# Patient Record
Sex: Male | Born: 1940 | Race: White | Hispanic: No | Marital: Married | Smoking: Former smoker
Health system: Southern US, Community
[De-identification: ages and names within clinical notes are randomized; demographics above are authoritative.]

## PROBLEM LIST (undated history)

## (undated) DIAGNOSIS — Z87442 Personal history of urinary calculi: Secondary | ICD-10-CM

## (undated) DIAGNOSIS — G454 Transient global amnesia: Secondary | ICD-10-CM

## (undated) DIAGNOSIS — I2510 Atherosclerotic heart disease of native coronary artery without angina pectoris: Secondary | ICD-10-CM

## (undated) DIAGNOSIS — I8390 Asymptomatic varicose veins of unspecified lower extremity: Secondary | ICD-10-CM

## (undated) DIAGNOSIS — E7800 Pure hypercholesterolemia, unspecified: Secondary | ICD-10-CM

## (undated) DIAGNOSIS — I10 Essential (primary) hypertension: Secondary | ICD-10-CM

## (undated) DIAGNOSIS — M1990 Unspecified osteoarthritis, unspecified site: Secondary | ICD-10-CM

## (undated) DIAGNOSIS — R413 Other amnesia: Secondary | ICD-10-CM

## (undated) HISTORY — DX: Other amnesia: R41.3

## (undated) HISTORY — DX: Asymptomatic varicose veins of unspecified lower extremity: I83.90

## (undated) HISTORY — DX: Transient global amnesia: G45.4

---

## 1998-09-16 ENCOUNTER — Emergency Department (HOSPITAL_COMMUNITY): Admission: EM | Admit: 1998-09-16 | Discharge: 1998-09-16 | Payer: Self-pay

## 2003-11-20 ENCOUNTER — Ambulatory Visit (HOSPITAL_COMMUNITY): Admission: RE | Admit: 2003-11-20 | Discharge: 2003-11-20 | Payer: Self-pay | Admitting: Cardiovascular Disease

## 2009-03-30 ENCOUNTER — Emergency Department (HOSPITAL_COMMUNITY): Admission: EM | Admit: 2009-03-30 | Discharge: 2009-03-30 | Payer: Self-pay | Admitting: Emergency Medicine

## 2009-05-21 ENCOUNTER — Emergency Department (HOSPITAL_COMMUNITY): Admission: EM | Admit: 2009-05-21 | Discharge: 2009-05-21 | Payer: Self-pay | Admitting: Emergency Medicine

## 2009-07-02 ENCOUNTER — Encounter (INDEPENDENT_AMBULATORY_CARE_PROVIDER_SITE_OTHER): Payer: Self-pay | Admitting: General Surgery

## 2009-07-02 ENCOUNTER — Ambulatory Visit (HOSPITAL_COMMUNITY): Admission: RE | Admit: 2009-07-02 | Discharge: 2009-07-02 | Payer: Self-pay | Admitting: General Surgery

## 2009-07-04 IMAGING — CT CT PELVIS W/O CM
2 of 4 series · 17 of 46 positions shown, 19 images · non-contrast
Comparison: None

CT ABDOMEN

CLINICAL DATA: Right-sided flank pain.  Onset last night.  No
vomiting or diarrhea.  No fever or chills.

CT ABDOMEN AND PELVIS WITHOUT CONTRAST (CT UROGRAM)
TECHNIQUE: Contiguous axial images of the abdomen and pelvis
without oral or intravenous contrast were obtained.

[Series 2: renal stone · axial · 0.85mm/px · z∈[-510,-30]mm · 14 of 106 slices shown, 16 images]
[im 5/106  soft-tissue]
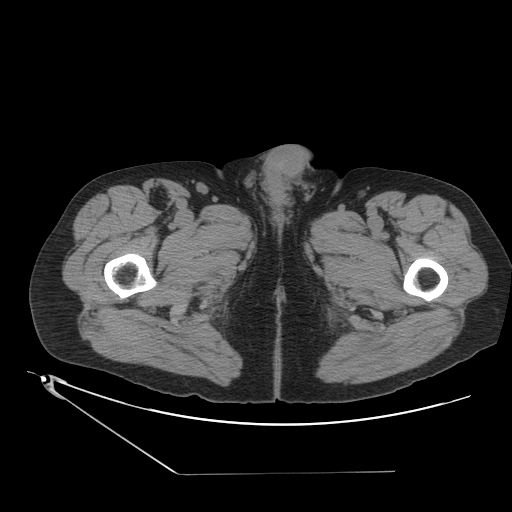
[im 5/106  bone]
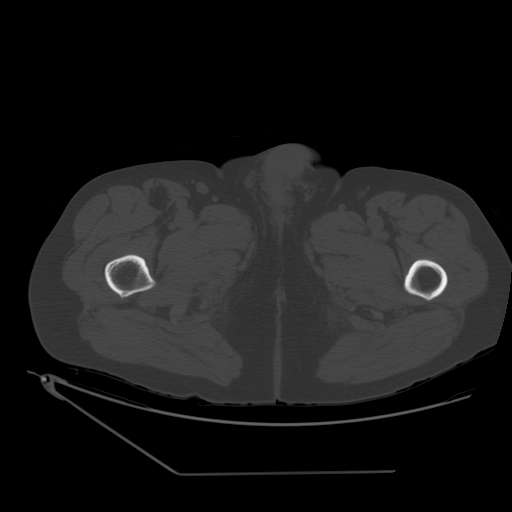
[im 13/106  soft-tissue]
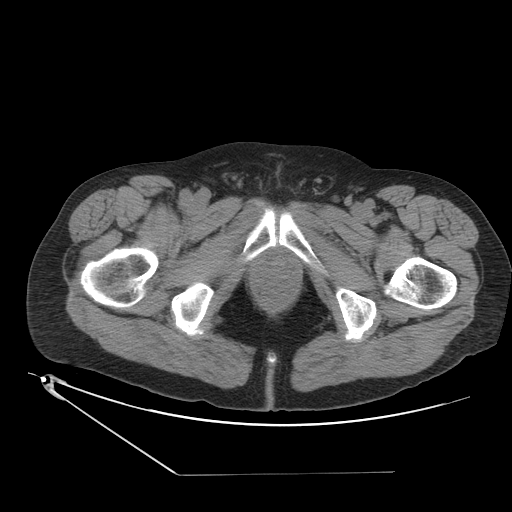
[im 22/106  soft-tissue]
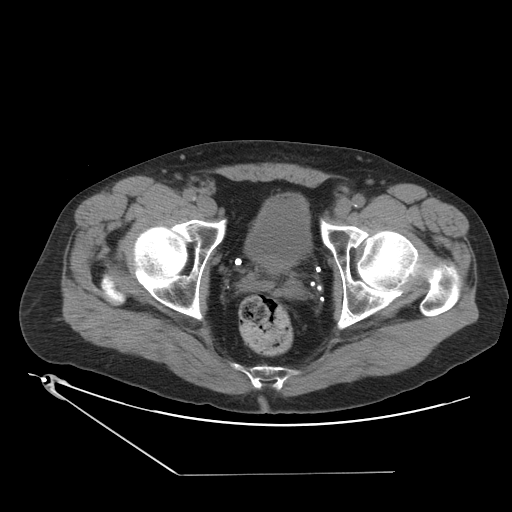
[im 30/106  soft-tissue]
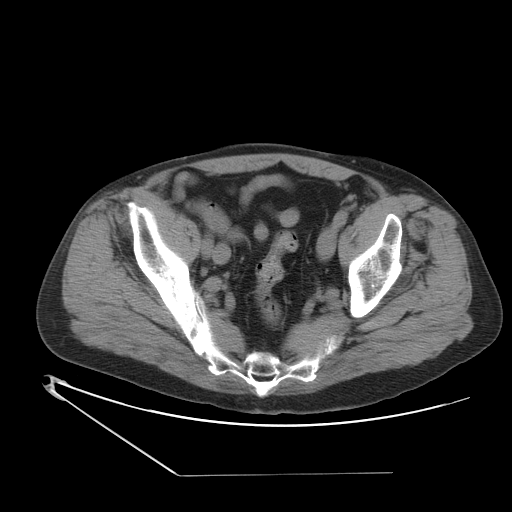
[im 34/106  soft-tissue]
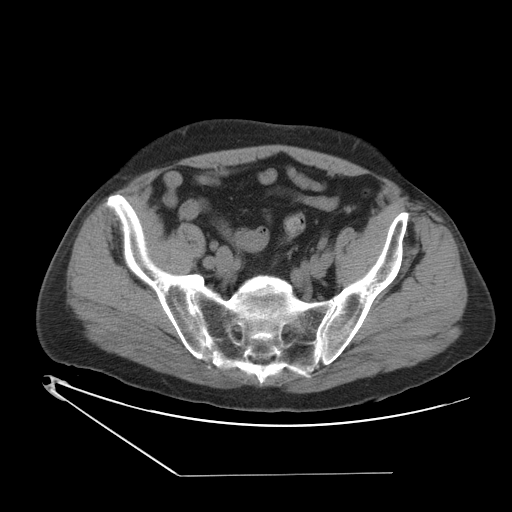
[im 43/106  soft-tissue]
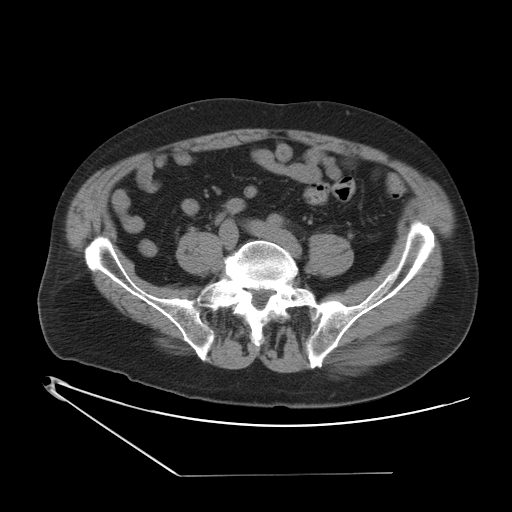
[im 51/106  soft-tissue]
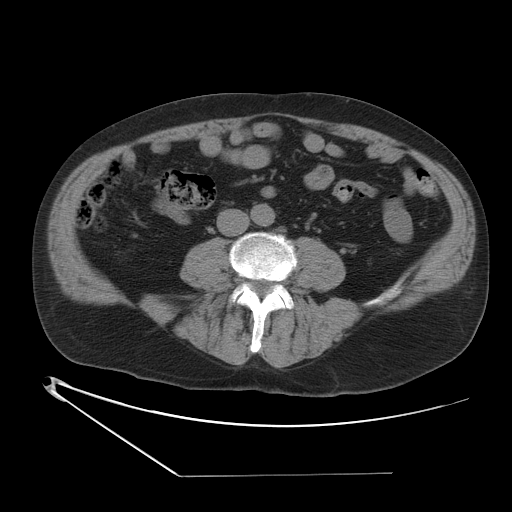
[im 55/106  soft-tissue]
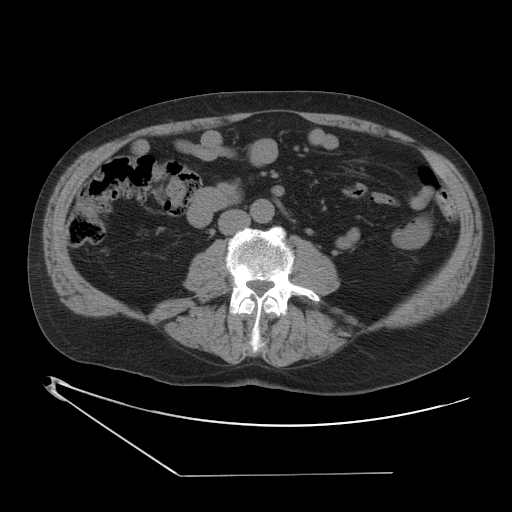
[im 64/106  soft-tissue]
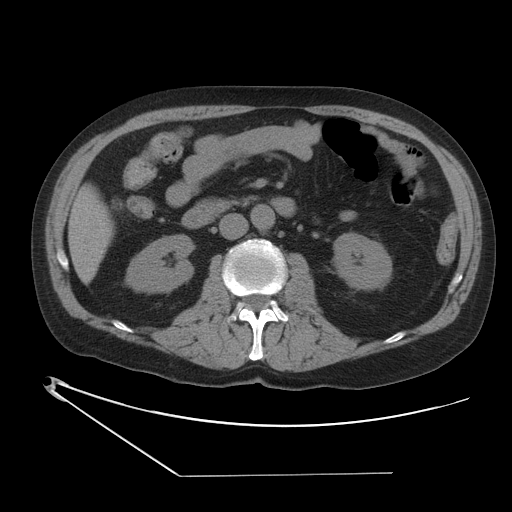
[im 64/106  bone]
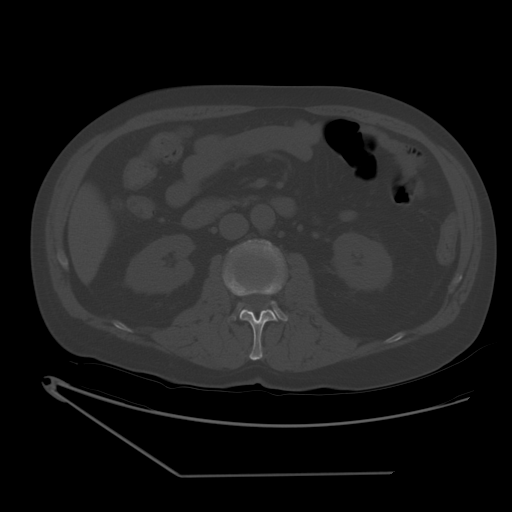
[im 72/106  soft-tissue]
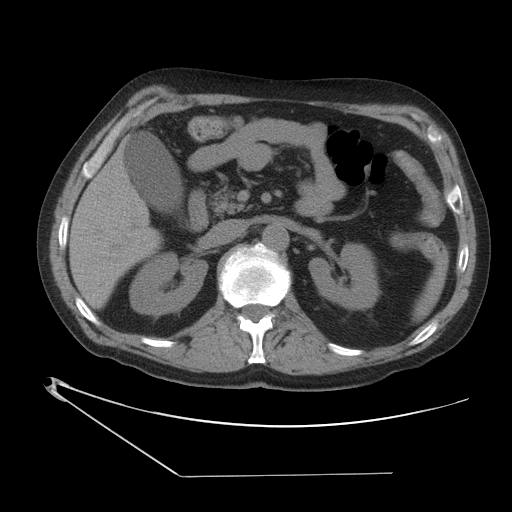
[im 80/106  soft-tissue]
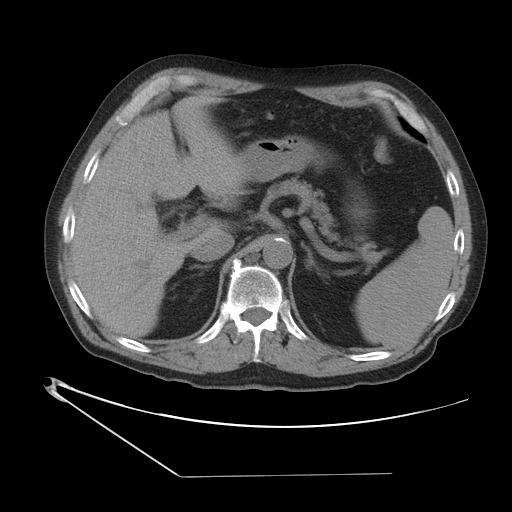
[im 85/106  soft-tissue]
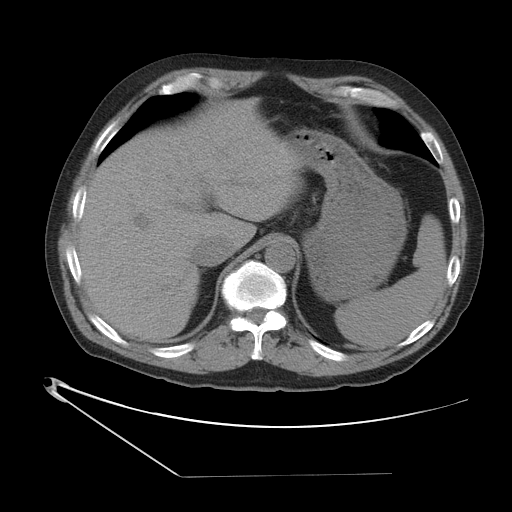
[im 93/106  soft-tissue]
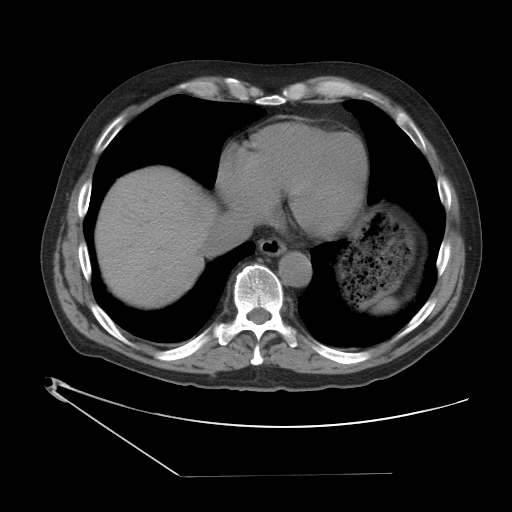
[im 101/106  soft-tissue]
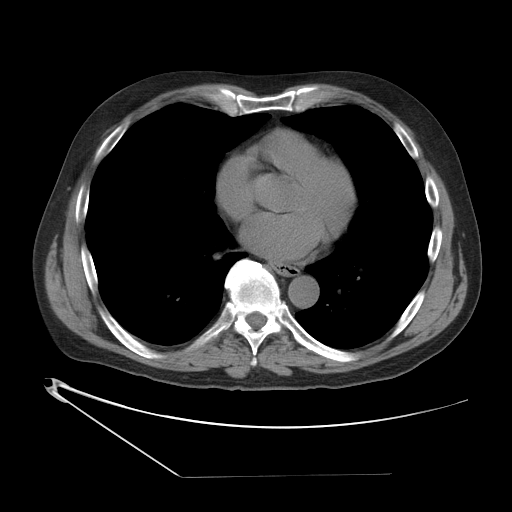

[Series 401: reformatted · coronal · 1.04mm/px · 3 of 166 slices shown]
[im 56/166  soft-tissue]
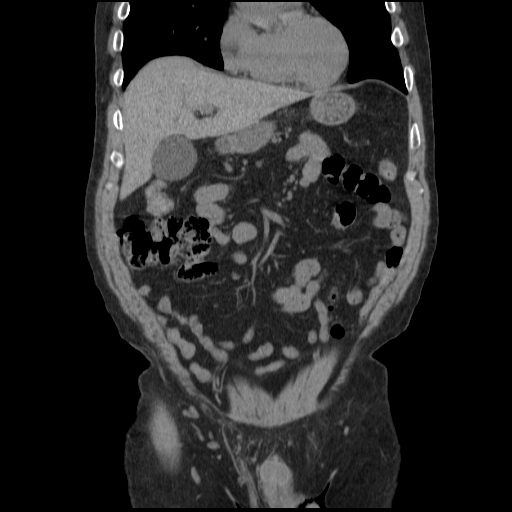
[im 74/166  soft-tissue]
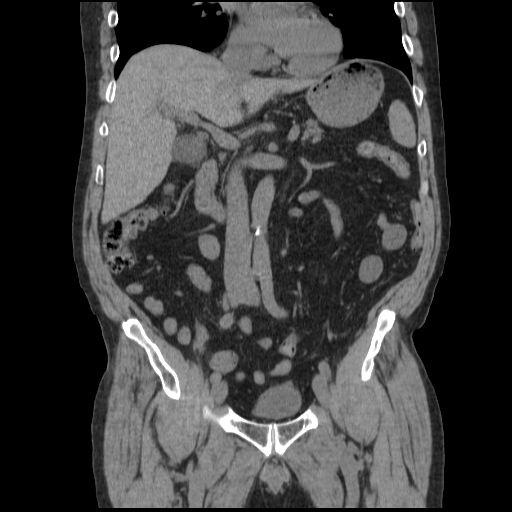
[im 92/166  soft-tissue]
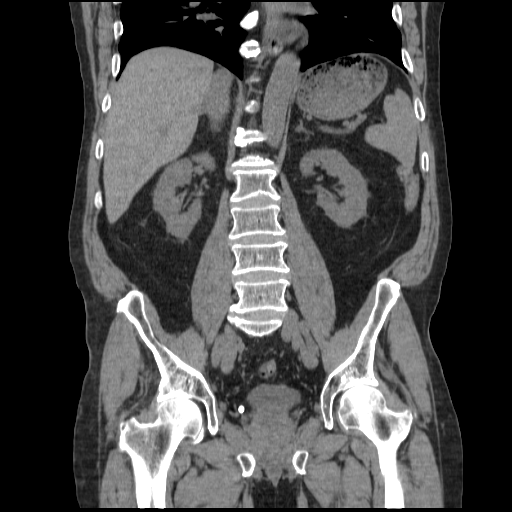

[17 of 46 positions shown; findings below may reference images not displayed]

FINDINGS: Exam is limited for evaluation of entities other than
urinary tract calculi due to lack of oral or intravenous contrast.

 Mild bibasilar atelectasis.  Mild coronary atherosclerosis. Normal
heart size without pericardial or pleural effusion.  Normal
uninfused appearance of the liver, spleen, stomach, pancreas.
Borderline prominence of the gallbladder.  No calcified stone or
specific evidence of acute cholecystitis.  No biliary ductal
dilatation.

Normal adrenal glands.  Mild perirenal edema is symmetric and
nonspecific but may relate to mild renal insufficiency.  No renal
calculi or hydronephrosis.

No retroperitoneal or retrocrural adenopathy. Normal colon and
terminal ileum.  The appendix is normal on image 61. Normal
abdominal small bowel without ascites.
IMPRESSION: 1. No renal calculi or hydronephrosis.
2.  Normal appendix.
3.  Borderline prominence of the gallbladder without specific
evidence of acute cholecystitis.  If this is a clinical concern,
ultrasound would be suggested.

CT PELVIS
FINDINGS: No distal urinary tract calculi or hydroureter.
Scattered colonic diverticula.  Normal pelvic small bowel. No
pelvic adenopathy.    Normal urinary bladder.  Mild prostatomegaly.
No significant free fluid.
IMPRESSION: No acute pelvic process.

## 2009-10-02 IMAGING — CR DG CHEST 2V
2 series · 2 of 2 positions shown · non-contrast
Comparison: None

CLINICAL DATA: Cholecystitis. Preop respiratory exam.

CHEST - 2 VIEW

[view not recorded (1 of 2)]
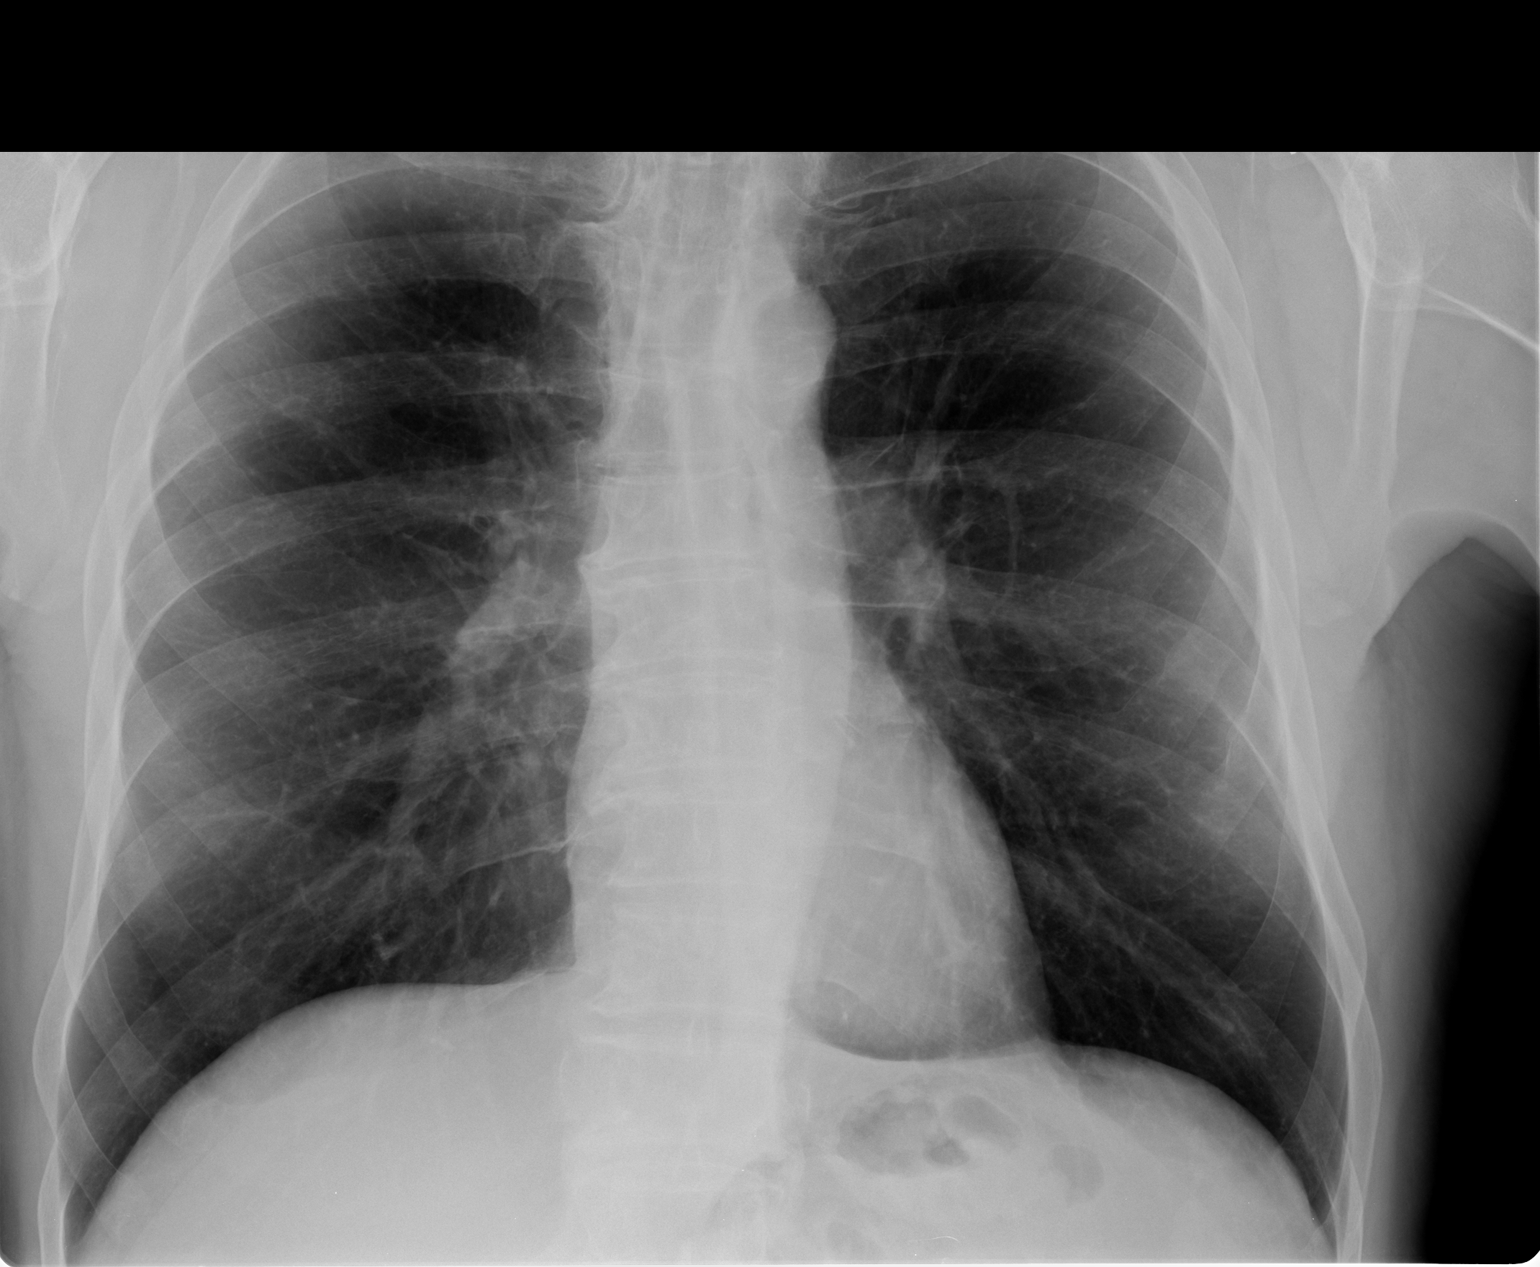

[view not recorded (2 of 2)]
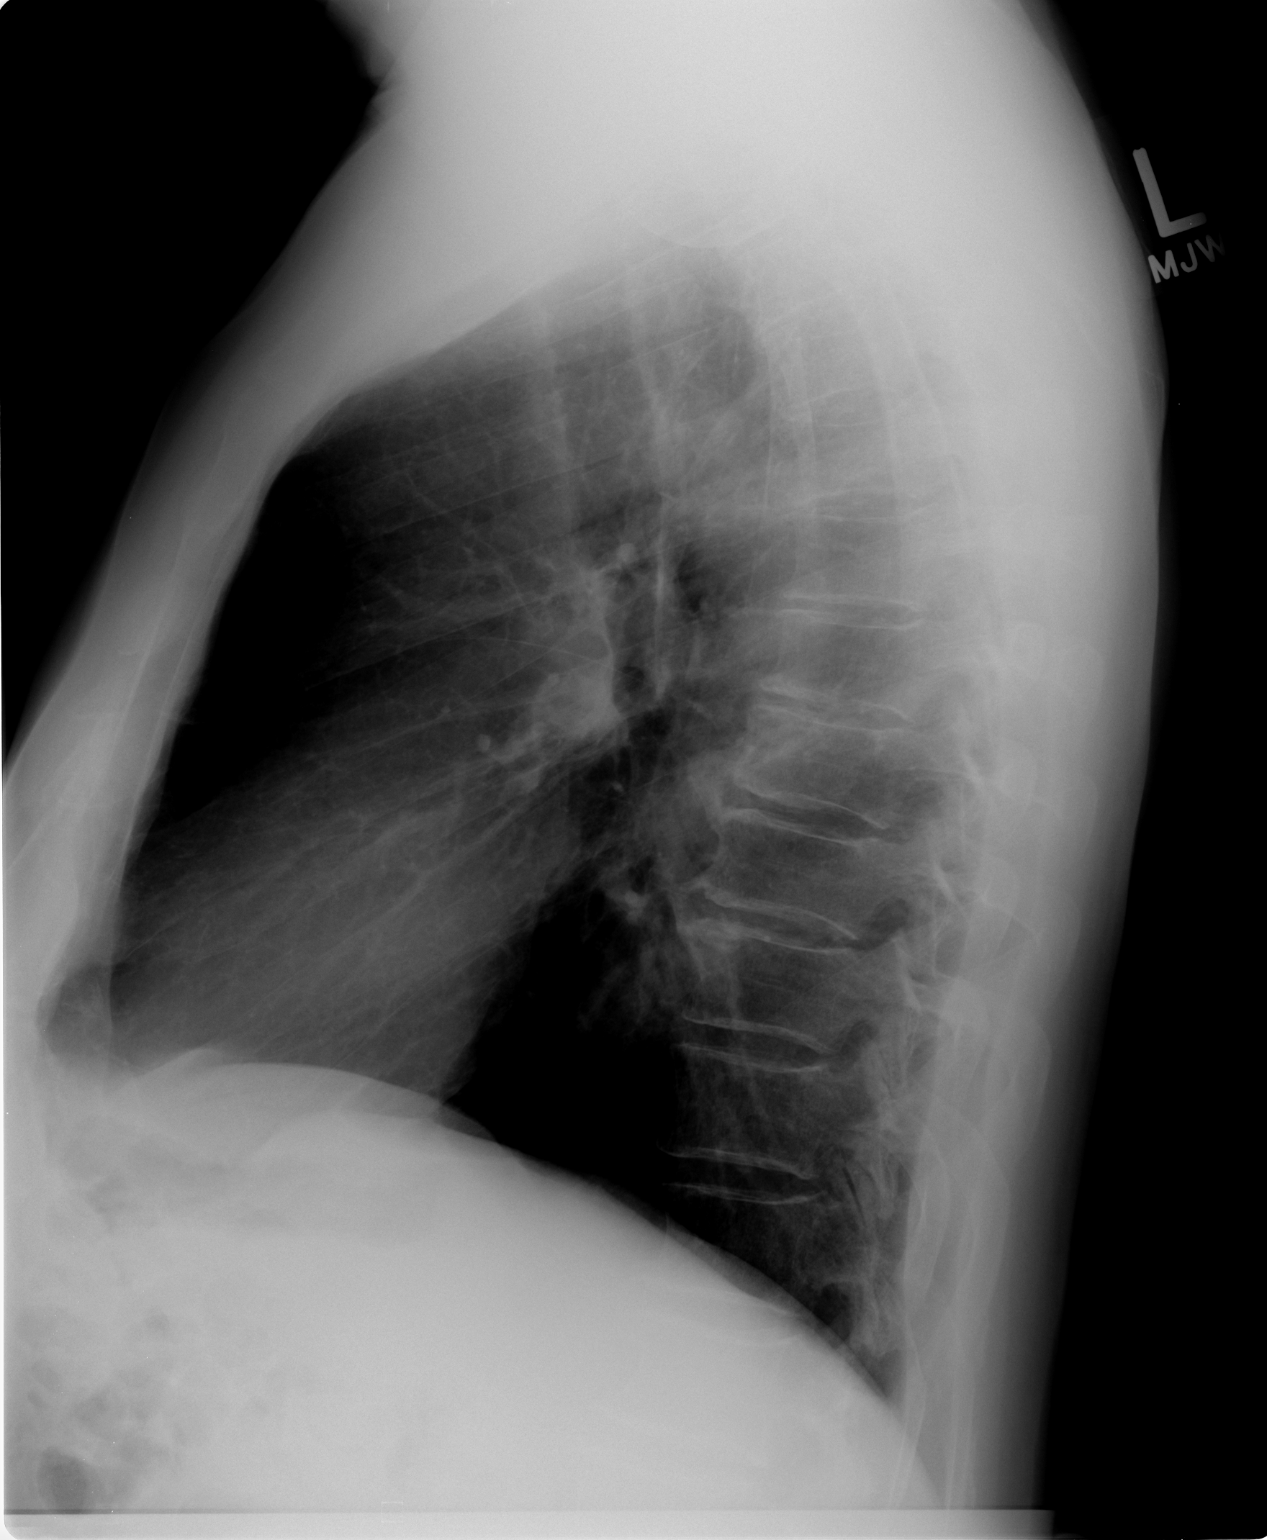

[2 of 2 positions shown; findings below may reference images not displayed]

FINDINGS: The cardiac silhouette, mediastinal and hilar contours
are within normal limits.  Mild COPD changes.  No acute pulmonary
findings.  The bony thorax is intact.
IMPRESSION: No acute cardiopulmonary findings.  Mild COPD changes.

## 2011-02-18 LAB — DIFFERENTIAL
Basophils Absolute: 0 10*3/uL (ref 0.0–0.1)
Basophils Relative: 1 % (ref 0–1)
Eosinophils Absolute: 0.1 10*3/uL (ref 0.0–0.7)
Eosinophils Relative: 2 % (ref 0–5)
Lymphocytes Relative: 23 % (ref 12–46)
Lymphs Abs: 1.3 10*3/uL (ref 0.7–4.0)
Monocytes Absolute: 0.3 10*3/uL (ref 0.1–1.0)
Monocytes Relative: 6 % (ref 3–12)
Neutro Abs: 4.1 10*3/uL (ref 1.7–7.7)
Neutrophils Relative %: 69 % (ref 43–77)

## 2011-02-18 LAB — CBC
HCT: 40.1 % (ref 39.0–52.0)
Hemoglobin: 14.1 g/dL (ref 13.0–17.0)
MCHC: 35.3 g/dL (ref 30.0–36.0)
MCV: 91 fL (ref 78.0–100.0)
Platelets: 189 10*3/uL (ref 150–400)
RBC: 4.41 MIL/uL (ref 4.22–5.81)
RDW: 14.5 % (ref 11.5–15.5)
WBC: 5.9 10*3/uL (ref 4.0–10.5)

## 2011-02-18 LAB — COMPREHENSIVE METABOLIC PANEL WITH GFR
ALT: 15 U/L (ref 0–53)
AST: 29 U/L (ref 0–37)
Albumin: 4.1 g/dL (ref 3.5–5.2)
Alkaline Phosphatase: 65 U/L (ref 39–117)
BUN: 11 mg/dL (ref 6–23)
CO2: 26 meq/L (ref 19–32)
Calcium: 9.4 mg/dL (ref 8.4–10.5)
Chloride: 105 meq/L (ref 96–112)
Creatinine, Ser: 0.89 mg/dL (ref 0.4–1.5)
GFR calc non Af Amer: >60 mL/min
Glucose, Bld: 100 mg/dL — ABNORMAL HIGH (ref 70–99)
Potassium: 4.2 meq/L (ref 3.5–5.1)
Sodium: 137 meq/L (ref 135–145)
Total Bilirubin: 1 mg/dL (ref 0.3–1.2)
Total Protein: 6.9 g/dL (ref 6.0–8.3)

## 2011-02-19 LAB — CBC
HCT: 39.3 % (ref 39.0–52.0)
MCHC: 35.1 g/dL (ref 30.0–36.0)
Platelets: 194 10*3/uL (ref 150–400)
RBC: 4.26 MIL/uL (ref 4.22–5.81)
RDW: 14.1 % (ref 11.5–15.5)
WBC: 6.5 10*3/uL (ref 4.0–10.5)

## 2011-02-19 LAB — DIFFERENTIAL
Basophils Relative: 1 % (ref 0–1)
Lymphocytes Relative: 27 % (ref 12–46)
Lymphs Abs: 1.7 10*3/uL (ref 0.7–4.0)
Monocytes Relative: 9 % (ref 3–12)
Neutro Abs: 3.9 10*3/uL (ref 1.7–7.7)
Neutrophils Relative %: 60 % (ref 43–77)

## 2011-02-19 LAB — COMPREHENSIVE METABOLIC PANEL
Albumin: 3.7 g/dL (ref 3.5–5.2)
BUN: 17 mg/dL (ref 6–23)
Calcium: 8.9 mg/dL (ref 8.4–10.5)
Creatinine, Ser: 0.97 mg/dL (ref 0.4–1.5)
Glucose, Bld: 104 mg/dL — ABNORMAL HIGH (ref 70–99)
Total Protein: 6.5 g/dL (ref 6.0–8.3)

## 2011-02-21 LAB — HEPATIC FUNCTION PANEL
AST: 34 U/L (ref 0–37)
Albumin: 4 g/dL (ref 3.5–5.2)
Alkaline Phosphatase: 67 U/L (ref 39–117)
Total Bilirubin: 0.5 mg/dL (ref 0.3–1.2)

## 2011-02-21 LAB — BASIC METABOLIC PANEL
Chloride: 106 mEq/L (ref 96–112)
Creatinine, Ser: 0.95 mg/dL (ref 0.4–1.5)
GFR calc Af Amer: >60 mL/min (ref >60)
Sodium: 139 mEq/L (ref 135–145)

## 2011-02-21 LAB — DIFFERENTIAL
Lymphs Abs: 1.8 10*3/uL (ref 0.7–4.0)
Monocytes Relative: 8 % (ref 3–12)
Neutro Abs: 4.2 10*3/uL (ref 1.7–7.7)
Neutrophils Relative %: 62 % (ref 43–77)

## 2011-02-21 LAB — CBC
MCV: 92.5 fL (ref 78.0–100.0)
RBC: 4.46 MIL/uL (ref 4.22–5.81)
WBC: 6.8 10*3/uL (ref 4.0–10.5)

## 2011-02-21 LAB — URINALYSIS, ROUTINE W REFLEX MICROSCOPIC
Glucose, UA: NEGATIVE mg/dL
Protein, ur: NEGATIVE mg/dL
Specific Gravity, Urine: 1.021 (ref 1.005–1.030)
pH: 6.5 (ref 5.0–8.0)

## 2011-03-28 NOTE — Op Note (Signed)
Noah Campbell, Noah Campbell              ACCOUNT NO.:  [id number]   MEDICAL RECORD NO.:  [id number]          PATIENT TYPE:  AMB   LOCATION:  SDS                          FACILITY:  [facility]   PHYSICIAN:  Cherylynn Ridges, M.D.    DATE OF BIRTH:  1941/01/14   DATE OF PROCEDURE:  07/02/2009  DATE OF DISCHARGE:  07/02/2009                               OPERATIVE REPORT   PREOPERATIVE DIAGNOSIS:  Symptomatic cholelithiasis.   POSTOPERATIVE DIAGNOSIS:  Symptomatic cholelithiasis.   PROCEDURE:  Laparoscopic cholecystectomy.   SURGEON:  Cherylynn Ridges, MD   ANESTHESIA:  General endotracheal.   ESTIMATED BLOOD LOSS:  Less than 20 mL.   COMPLICATIONS:  No complications.   CONDITION:  Stable.   FINDINGS:  Evidence of chronic cholecystitis.  No acute cholecystitis.  Cholangiogram was not performed because of normal preoperative LFTs   INDICATIONS FOR OPERATION:  The patient is a 70 year old otherwise  healthy with symptomatic gallstones.   OPERATION:  The patient was taken to the operating room and placed on  the table in the supine position.  After an adequate general  endotracheal anesthetic was administered, he was prepped and draped in  the usual sterile manner exposing the entire abdomen.   A supraumbilical midline incision was made using #15 blade.  The patient  had an umbilical hernia that we used in order to access peritoneal  cavity where a purse-string suture of 0 Vicryl was placed.  We then  passed Hasson cannula through the bluntly dissected peritoneum into the  peritoneal cavity and secured it in place with a purse-string suture.  Subsequently, 2 right costal margin 5-mm cannulas and a subxiphoid 5-mm  cannula were passed under direct vision.  The patient was placed reverse  Trendelenburg, carbon dioxide was insufflated into the peritoneal cavity  up to a maximal pressure of 50 mmHg.  Dissection was begun.   The gallbladder was grasped and was retracted towards the anterior  abdominal wall and the right upper quadrant.  We dissected out the  omentum and adhesions and peritoneum overlying triangle of Calot and  hepatic duodenal triangle.  The patient had an artery outside of the  triangle which clearly went to the gallbladder which was clipped  proximally and distally and transected.  We then dissect out the duct  with 2 distal clips were placed and the single one on the gallbladder  side.  The cystic artery posterior branch was then identified and was  clipped proximally and distally and transected and we dissected out the  gallbladder from its bed with minimal difficulty retrieving it from the  supraumbilical site using an Endocatch bag.   There was spillage of bile from a perforated gallbladder during the  process of removing it from its bed.  There was some bile staining, but  no bile leakage.   Once the gallbladder was removed, we irrigated with about a liter and  half saline solution until the bed was clear and there was no further  evidence of bile staining.  Once this was done, we aspirated all fluid  and gas from above the liver,  removed all cannulas and closed.   All needle counts, sponge counts, and instrument counts were correct.  The supraumbilical site was closed with a purse-string suture which was  in place.  We injected 0.25% Marcaine with epi at all sites, closed the  supraumbilical skin site using a running subcuticular stitch of 4-0  Monocryl.  Dermabond, Steri-Strips, and Tegaderm was used complete all  the other dressings.  All counts were correct.      Cherylynn Ridges, M.D.  Electronically Signed     JOW/MEDQ  D:  07/02/2009  T:  07/03/2009  Job:  [id number]

## 2011-03-31 NOTE — Cardiovascular Report (Signed)
NAME:  [name], [name] NO.:  [id number]   MEDICAL RECORD NO.:  [id number]                   PATIENT TYPE:  OIB   LOCATION:  2856                                 FACILITY:  [facility]   PHYSICIAN:  Vesta Mixer, M.D.              DATE OF BIRTH:  [birth year]   DATE OF PROCEDURE:  11/20/2003  DATE OF DISCHARGE:                              CARDIAC CATHETERIZATION   Noah Campbell is a 70 year old gentleman with a recent onset of chest pain.  He  recently had a stress Cardiolite study which revealed inferior wall  ischemia.   He was referred for heart catheterization for further evaluation.   PROCEDURE:  Left heart catheterization with coronary angiography.   HEMODYNAMICS:  LV pressure was 123/14 with aortic pressure of 123/71.   CORONARY ANGIOGRAPHY:  1. Left main is smooth and normal.  2. 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  This stenosis is right at the take off of the first diagonal     vessel.  The remainder of the LAD has minor luminal irregularities and is     fairly tortuous.  There are no discrete stenoses.  3. The first diagonal vessel has a 30-40% stenosis at its origin.  The     remainder of the diagonal vessel is unremarkable.  4. The left circumflex artery is a small-to-moderate size vessel.  It is     fairly normal.  It gives off a moderate size obtuse marginal artery which     is fairly normal.  5. The right coronary artery has a mid 10-20% irregularity.  6. The posterior descending artery and the posterior lateral segment artery     have only minor luminal irregularities.   LEFT VENTRICULOGRAM:  Left ventriculogram was performed was a 30 RAO  position.  It revealed normal left ventricular systolic function with an  ejection fraction between 60 and 65%.   COMPLICATIONS:  None.   CONCLUSIONS:  Minor coronary artery irregularities.  We will continue with  medical therapy.                                Vesta Mixer, M.D.    PJN/MEDQ  D:  11/20/2003  T:  11/20/2003  Job:  [id number]

## 2011-03-31 NOTE — H&P (Signed)
NAME:  Noah Campbell, Noah Campbell NO.:  [id number]   MEDICAL RECORD NO.:  [id number]                   PATIENT TYPE:   LOCATION:                                       FACILITY:   PHYSICIAN:  Vesta Mixer, M.D.              DATE OF BIRTH:   DATE OF ADMISSION:  DATE OF DISCHARGE:                                HISTORY & PHYSICAL   HISTORY:  Noah Campbell is a 70 year old gentleman with a recent onset of chest  pain.  He was recently seen by Dr. Cassell Campbell.  He had a stress  Cardiolite study which revealed the presence of inferior wall ischemia.  We  were asked to see him today for further evaluation and management of this  ischemia.   Noah Campbell has had several episodes of chest pain.  One episode was  particularly bad.  It occurred at rest.  He was not doing any particular  activity, but he had been loading some wood the previous day.  He felt  something that felt like a muscle pull.  Over the next day or so, the pain  started to radiate up into his neck and then to his back.  It seemed to vary  as he changed position.  He awoke multiple times over the next several days  with chest pain.  He presented to his medical doctor and then to see [place].  A  subsequent stress Cardiolite study revealed the presence of inferior wall  ischemia with a left ventricular systolic function that was at the low  limits of normal.   CURRENT MEDICATIONS:  1. Glucosamine once a day.  2. Plavix 75 mg a day.   ALLERGIES:  Aspirin causes sores in his mouth.   PAST MEDICAL HISTORY:  Arthritis.   SOCIAL HISTORY:  The patient is a nonsmoker.  He has an Management consultant.  He does not drink alcohol.   FAMILY HISTORY:  Positive for coronary artery disease.   REVIEW OF SYSTEMS:  Reviewed and is essentially negative.   PHYSICAL EXAMINATION:  GENERAL:  He is a middle-aged gentleman in no acute  distress.  He is alert and oriented x3, and his mood and affect are normal.  VITAL  SIGNS:  Weight 236, blood pressure 104/70 with a heart rate of 60.  HEENT:  Exam reveals 2+ carotids.  He has no bruits.  There is no JVD, no  thyromegaly.  LUNGS:  Clear to auscultation.  HEART:  Regular rate, S1, S2 with no murmur, gallop or rub.  ABDOMEN:  Exam reveals good bowel sounds and is nontender.  EXTREMITIES:  He has no clubbing, cyanosis or edema.  NEUROLOGIC:  Exam was nonfocal.   EKG:  Reveals normal sinus rhythm with no ST-T wave changes.   Noah Campbell presents with some episodes of chest pain.  These episodes of  chest pain are somewhat atypical; however, he has had a  stress Cardiolite  study which is strongly suggestive of coronary artery disease.  I have  recommended that we proceed with heart catheterization.  We have discussed  the risks, benefits and options of heart catheterization.  He understands  and agrees to proceed.                                                Vesta Mixer, M.D.    PJN/MEDQ  D:  11/19/2003  T:  11/19/2003  Job:  [id number]   cc:   Evelena Peat, M.D.  [address]  [place]  [place] [address]  Fax: [phone]

## 2012-11-13 HISTORY — PX: LAPAROSCOPIC CHOLECYSTECTOMY: SUR755

## 2015-05-06 ENCOUNTER — Encounter: Payer: Self-pay | Admitting: Neurology

## 2015-05-06 ENCOUNTER — Telehealth: Payer: Self-pay | Admitting: Neurology

## 2015-05-06 ENCOUNTER — Ambulatory Visit (INDEPENDENT_AMBULATORY_CARE_PROVIDER_SITE_OTHER): Payer: Medicare Other | Admitting: Neurology

## 2015-05-06 VITALS — BP 127/81 | HR 59 | Ht 72.0 in | Wt 208.4 lb

## 2015-05-06 DIAGNOSIS — G454 Transient global amnesia: Secondary | ICD-10-CM

## 2015-05-06 DIAGNOSIS — R413 Other amnesia: Secondary | ICD-10-CM

## 2015-05-06 DIAGNOSIS — E538 Deficiency of other specified B group vitamins: Secondary | ICD-10-CM | POA: Diagnosis not present

## 2015-05-06 HISTORY — DX: Other amnesia: R41.3

## 2015-05-06 HISTORY — DX: Transient global amnesia: G45.4

## 2015-05-06 NOTE — Progress Notes (Signed)
Reason for visit: Confusion  Referring physician: Dr. Andres Labrum is a 74 y.o. male  History of present illness:  [name] is a 74 year old right-handed white male with a history of an event that occurred in September 2013. The patient had a 30 hour episode of amnesia that occurred upon awakening from a nap. The patient underwent MRI evaluation of the brain, CT of the brain that were unremarkable. Carotid Doppler study was unremarkable. The patient resolved from this, and seemed to do quite well. Since that time, he has had a brief event occurring every 4-6 months, again usually occurring after a nap. The patient may have some problems with confusion and memory for 5-10 minutes, but he will clear. The patient will have some dizziness with this, and indicates that the dizziness may last 2-3 days following the episode. He also have some alteration in taste lasting about 7 days following the events. More recently, the episodes have been more frequent, and have occurred once every 2 months. The patient has developed a general mild memory problem for long-term and short-term memory. The reports some numbness of the left greater right hand, more noticeable when he is inactive. He denies any weakness of the extremities, he denies any balance issues or difficulty controlling the bowels or the bladder. He otherwise has been quite healthy. He is sent to this office for further evaluation.  Past Medical History  Diagnosis Date  . 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 Varicose vein of leg     bilateral    Past Surgical History  Procedure Laterality Date  . Cholecystectomy      Family History  Problem Relation Age of Onset  . Colon cancer Mother   . Prostate cancer Father   . Heart attack Father   . Pancreatic cancer Brother     Social history:  reports that he quit smoking about 43 years ago. He does not have any smokeless tobacco history on  file. He reports that he does not drink alcohol or use illicit drugs.  Medications:  Prior to Admission medications   Medication Sig Start Date End Date Taking? Authorizing Provider  aspirin 81 MG tablet Take 81 mg by mouth daily.   Yes Historical Provider, MD  Boswellia-Glucosamine-Vit D (GLUCOSAMINE COMPLEX PO) Take 1 capsule by mouth daily.   Yes Historical Provider, MD  UNABLE TO FIND daily as needed. Med Name: Equate PM   Yes Historical Provider, MD      Allergies  Allergen Reactions  . Aleve [Naproxen Sodium]     Dizziness     ROS:  Out of a complete 14 system review of symptoms, the patient complains only of the following symptoms, and all other reviewed systems are negative.  Easy bruising, easy bleeding Joint pain Memory loss, numbness  Blood pressure 127/81, pulse 59, height 6' (1.829 m), weight 208 lb 6.4 oz (94.53 kg).  Physical Exam  General: The patient is alert and cooperative at the time of the examination.  Eyes: Pupils are equal, round, and reactive to light. Discs are flat bilaterally.  Neck: The neck is supple, no carotid bruits are noted.  Respiratory: The respiratory examination is clear.  Cardiovascular: The cardiovascular examination reveals a regular rate and rhythm, no obvious murmurs or rubs are noted.  Skin: Extremities are without significant edema.  Neurologic Exam  Mental status: The patient is alert and oriented x 3 at the time of the examination.  The patient has apparent normal recent and remote memory, with an apparently normal attention span and concentration ability. The Mini-Mental Status Examination done today shows a total score of 29/30. The patient is able to name 11 animals in 30 seconds.  Cranial nerves: Facial symmetry is present. There is good sensation of the face to pinprick and soft touch bilaterally. The strength of the facial muscles and the muscles to head turning and shoulder shrug are normal bilaterally. Speech is well  enunciated, no aphasia or dysarthria is noted. Extraocular movements are full. Visual fields are full. The tongue is midline, and the patient has symmetric elevation of the soft palate. No obvious hearing deficits are noted.  Motor: The motor testing reveals 5 over 5 strength of all 4 extremities. Good symmetric motor tone is noted throughout.  Sensory: Sensory testing is intact to pinprick, soft touch, vibration sensation, and position sense on all 4 extremities. No evidence of extinction is noted.  Coordination: Cerebellar testing reveals good finger-nose-finger and heel-to-shin bilaterally. Tinel's sign is positive at the wrist bilaterally, left greater than right.  Gait and station: Gait is normal. Tandem gait is normal. Romberg is negative. No drift is seen.  Reflexes: Deep tendon reflexes are symmetric and normal bilaterally. Toes are downgoing bilaterally.   Assessment/Plan:  1. History of transient global amnesia  2. Transient confusion following sleep  3. Mild memory disorder  4. 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 The patient has had multiple episodes of brief confusion following sleep that have occurred, unusual features of dizziness and taste alteration that persist several days after these events. The etiology of these episodes is not clear, but they are not consistent with the diagnosis of transient global amnesia. The patient will need to be evaluated for possible seizures. I think TIA events are less likely, but the patient is to remain on aspirin therapy. The patient does not report headaches. A possible sleep disorder does need to be considered. The patient does not wish to pursue any further workup for his carpal tunnel syndrome. The memory disorder will need to be followed. We will check blood work today, he will be set up for an EEG study. He  will follow-up in 4 months.  Jill Alexanders MD 05/06/2015 8:20 PM  [facility] [address] [place], [place] [address]  Phone [phone] Fax [phone]

## 2015-05-06 NOTE — Telephone Encounter (Signed)
I called the patient. The EEG study was unremarkable. I will call the patient might get the blood work results.

## 2015-05-06 NOTE — Patient Instructions (Signed)
Transient Global Amnesia °Your exam shows you may have a rare problem that causes temporary amnesia, an inability to remember what has happened in the past several hours or day. Transient global amnesia (TGA) means you cannot remember recent events, even though you may look and act normally. There are no physical problems in TGA; your vision, strength, coordination, and sensations are all normal. TGA occurs most often in older patients, and in patients with high blood pressure. The exact cause of TGA is not known, although it is thought to be due to vascular disease in your brain. °There is usually a complete return to normal memory capacity after an episode is over. About 20-30% of patients with TGA will have more than one episode, and some studies show a slight increased risk for stroke. Although no special treatment is needed, taking up to one adult aspirin daily reduces the risk of having a stroke. You should consider taking aspirin daily if you are not allergic to it. Medical evaluation may require specialized scans to check for stroke or other brain problems, an EEG (brain wave test), or blood tests. °Avoid alcohol or any sedating medicines until you are completely recovered. Call your doctor right away if your memory is not fully recovered after 24 hours, or if you have any other serious problems including: °· Severe headache, nausea, vomiting, fever, or other symptoms of an infection. °· Weakness, numbness, difficulty with movement, or incoordination. °· Blurred or double vision, unusual sleepiness, seizures, or fainting. °Document Released: 12/07/2004 Document Revised: 01/22/2012 Document Reviewed: 10/30/2005 °Exit[employer]® Patient Information ©2015 [employer]. This information is not intended to replace advice given to you by your health care provider. Make sure you discuss any questions you have with your health care provider. ° °

## 2015-05-06 NOTE — Procedures (Signed)
    History:  Noah Campbell is a 74 year old gentleman with a history of episodes of confusion that may follow a brief nap. The patient may be confused for 5-10 minutes and then the confusional clear. He does have a history of an episode of transient global amnesia that occurred in September 2013. The patient is being evaluated for possible seizures.  This is a routine EEG. No skull defects are noted. Medications include aspirin and vitamin D.   EEG classification: Normal awake and drowsy  Description of the recording: The background rhythms of this recording consists of a fairly well modulated medium amplitude alpha rhythm of 9 Hz that is reactive to eye opening and closure. As the record progresses, the patient appears to remain in the waking state throughout the recording. Photic stimulation was performed, resulting in a bilateral and symmetric photic driving response. Hyperventilation was also performed, resulting in a minimal buildup of the background rhythm activities without significant slowing seen. Toward the end of the recording, the patient enters the drowsy state with slight symmetric slowing seen. The patient never enters stage II sleep. At no time during the recording does there appear to be evidence of spike or spike wave discharges or evidence of focal slowing. EKG monitor shows no evidence of cardiac rhythm abnormalities with a heart rate of 54.  Impression: This is a normal EEG recording in the waking and drowsy state. No evidence of ictal or interictal discharges are seen.

## 2015-05-08 LAB — RPR: RPR: NONREACTIVE

## 2015-05-08 LAB — VITAMIN B12: VITAMIN B 12: 156 pg/mL — AB (ref 211–946)

## 2015-05-08 LAB — COPPER, SERUM: COPPER: 98 ug/dL (ref 72–166)

## 2015-05-09 ENCOUNTER — Telehealth: Payer: Self-pay | Admitting: Neurology

## 2015-05-09 NOTE — Telephone Encounter (Signed)
I called the patient. The vitamin B12 level was low. I will have him come in for a B12 shot, and then go on oral B12 at 1000 mcg a day.

## 2015-05-10 NOTE — Telephone Encounter (Signed)
B12 injection scheduled for 6/28 at 2 PM.

## 2015-05-11 ENCOUNTER — Ambulatory Visit (INDEPENDENT_AMBULATORY_CARE_PROVIDER_SITE_OTHER): Payer: Medicare Other

## 2015-05-11 DIAGNOSIS — E538 Deficiency of other specified B group vitamins: Secondary | ICD-10-CM | POA: Diagnosis not present

## 2015-05-11 MED ORDER — CYANOCOBALAMIN 1000 MCG/ML IJ SOLN
1000.0000 ug | Freq: Once | INTRAMUSCULAR | Status: AC
  Administered 2015-05-11: 1000 ug via INTRAMUSCULAR

## 2015-08-18 ENCOUNTER — Ambulatory Visit: Payer: Medicare Other | Admitting: Neurology

## 2016-06-22 DIAGNOSIS — I25118 Atherosclerotic heart disease of native coronary artery with other forms of angina pectoris: Secondary | ICD-10-CM | POA: Insufficient documentation

## 2016-07-09 DIAGNOSIS — I209 Angina pectoris, unspecified: Secondary | ICD-10-CM | POA: Diagnosis present

## 2016-07-09 NOTE — H&P (Signed)
OFFICE VISIT NOTES COPIED TO EPIC FOR DOCUMENTATION  . History of Present Illness Noah Campbell AGNP-C; 06/30/2016 8:03 AM) The patient is a 75 year old male who presents for a Follow-up for Chest pain. He presented as an urgent add-on for evaluation of intermittent episodes of exertional chest pain. He states symptoms began approximately 4-5 weeks ago. He was at church and he was walking quickly across the church when he developed a severe mid to left-sided chest tightening and shortness of breath. He sat down on a bench and symptoms resolved within one to 2 minutes. He is continuing to have intermittent episodes since then, however not as severe as the 1st, most recently yesterday. He says he is fine with lifting and light activities, however, every time he gets in a hurry or exerts himself to the point that his heart rate increases, he immediately developed chest tightening and shortness of breath which resolves with rest. Denies any PND, orthopnea, edema, dizziness, syncope, or symptoms suggestive of claudication or TIA. He has a very remote history of tobacco use, quit in 1973. Otherwise, no history of hypertension, hyperlipidemia (although no recent labs available), or diabetes.  He was scheduled for nuclear stress test and presents today for follow up. In the meantime, lipid panel has been obtain revealing markedly elevated lipids and he has already been started on Crestor.   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  Family History Anderson Malta Sergeant; Jul 01, 2016 10:28 AM) Mother  Deceased. at age 80 from natural causes; no heart attacks or strokes, agnina but no other cardiovascular conditions Father  Deceased. at age 16 from MI; no previous events or  strokes, no other cardiovascular conditions Brother 1  Deceased. at age 78 from pancreatic cancer; some form of heart diseases, no other cardiovascular conditions; 1 yr younger Brother 2  In stable health. 5 yrs younger; no cardiovascular conditions Brother 3  Deceased. at age 48 from drowning; no cardiovascular conditions; 6 yrs younger  Social History Lolita Lenz; 07-01-16 10:28 AM) Living Situation  Lives with spouse. Marital status  Married. Non Drinker/No Alcohol Use  Current tobacco use  Former smoker. quit in 1973 Number of Children   Past Surgical History Lolita Lenz; 07/01/2016 10:28 AM) Cholecystectomy 2013  Medication History Anderson Malta Sergeant; July 01, 2016 10:35 AM) Crestor (20MG Tablet, 1 (one) Tablet Oral daily, Taken starting 06/23/2016) Active. AmLODIPine Besylate (2.5MG Tablet, 1 (one) Tablet Oral daily, Taken starting 06/23/2016) Active. Coreg (3.125MG Tablet, 1 (one) Tablet Oral two times daily, Taken starting 06/22/2016) Active. Nitrostat (0.4MG Tab Sublingual, 1 (one) Tablet Sublingual every minutes as needed for chest pain, Taken starting 06/22/2016) Active. Aspirin (81MG Tablet DR, 1 Oral daily) Active. Vitamin B12 (1000MCG Tablet ER, 1 Oral daily) Active. Glucosamine 1500 Complex (1 Oral daily) Active. Medications Reconciled (medication present)  Diagnostic Studies History Anderson Malta Sergeant; 2016-07-01 10:28 AM) Nuclear stress test 06/23/2016 1. The resting electrocardiogram demonstrated normal sinus rhythm, normal resting conduction and no resting arrhythmias. Stress EKG was positive for my cardiac ischemia. There was 2 mm ST segment depression in the inferior and lateral leads and 1 mm ST elevation in aVR with peak exercise, which resolved at 1 minute into recovery however there was the appearance of ST segment changes at 2 minutes into recovery. This is strongly positive for myocardial ischemia. Patient exercised on a Bruce  protocol  for 7:30 minutes and achieved 9.2 MET S. Stress terminated due to dyspnea. Normal blood pressure response. 2. Perfusion imaging study demonstrates a moderate to large sized severe inferior. infero-septal and infero-apical severe ischemia. Left ventricular systolic function was normal at 56%. This is a high risk study, consider further cardiac workup. Labwork  06/22/2016: total cholesterol 263, triglycerides 159, HDL 43, LDL 188, LDL particle number [id number], LP-IR score 59 None 06/29/2016 (Marked as Inactive)    Review of Systems [employer], AGNP-C; 06/30/2016 8:5 AM) General Not Present- Anorexia, Fatigue and Fever. Respiratory Present- Decreased Exercise Tolerance and Difficulty Breathing on Exertion. Not Present- Cough. Cardiovascular Present- Chest Pain. Not Present- Claudications, Edema, Orthopnea, Palpitations and Paroxysmal Nocturnal Dyspnea. Gastrointestinal Not Present- Black, Tarry Stool, Change in Bowel Habits and Nausea. Neurological Not Present- Focal Neurological Symptoms and Syncope. Endocrine Not Present- Cold Intolerance, Excessive Sweating, Heat Intolerance and Thyroid Problems. Hematology Not Present- Anemia, Easy Bruising, Petechiae and Prolonged Bleeding.  Vitals Anderson Malta Sergeant; 06/29/2016 10:37 AM) 06/29/2016 10:28 AM Weight: 212 lb Height: 72in Body Surface Area: 2.18 m Body Mass Index: 28.75 kg/m  Pulse: 61 (Regular)  P.OX: 91% (Room air) BP: 110/70 (Sitting, Left Arm, Standard)       Physical Exam (Bridgette Ebony Hail, AGNP-C; 06/30/2016 8:5 AM) General Mental Status-Alert. General Appearance-Cooperative and Appears stated age. Build & Nutrition-Well built and Well nourished.  Head and Neck Thyroid Gland Characteristics - normal size and consistency and no palpable nodules.  Chest and Lung Exam Chest and lung exam reveals -quiet, even and easy respiratory effort with no use of accessory muscles, non-tender and on auscultation,  normal breath sounds, no adventitious sounds.  Cardiovascular Cardiovascular examination reveals -normal heart sounds, regular rate and rhythm with no murmurs, carotid auscultation reveals no bruits, abdominal aorta auscultation reveals no bruits and no prominent pulsation, femoral artery auscultation bilaterally reveals normal pulses, no bruits, no thrills, normal pedal pulses bilaterally and no digital clubbing, cyanosis, edema, increased warmth or tenderness.  Abdomen Palpation/Percussion Normal exam - Non Tender and No hepatosplenomegaly.  Neurologic Neurologic evaluation reveals -alert and oriented x 3 with no impairment of recent or remote memory. Motor-Grossly intact without any focal deficits.  Musculoskeletal Global Assessment Left Lower Extremity - no deformities, masses or tenderness, no known fractures. Right Lower Extremity - no deformities, masses or tenderness, no known fractures.  Assessment & Plan (Bridgette Allison AGNP-C; 06/30/2016 8:05 AM) Angina pectoris (I20.9) Story: Exercise myoview stress 06/23/2016: 1. The resting electrocardiogram demonstrated normal sinus rhythm, normal resting conduction and no resting arrhythmias. Stress EKG was positive for my cardiac ischemia. There was 2 mm ST segment depression in the inferior and lateral leads and 1 mm ST elevation in aVR with peak exercise, which resolved at 1 minute into recovery however there was the appearance of ST segment changes at 2 minutes into recovery. This is strongly positive for myocardial ischemia. Patient exercised on a Bruce protocol for 7:30 minutes and achieved 9.2 MET S. Stress terminated due to dyspnea. Normal blood pressure response. 2. Perfusion imaging study demonstrates a moderate to large sized severe inferior. infero-septal and infero-apical severe ischemia. Left ventricular systolic function was normal at 56%. This is a high risk study, consider further cardiac workup. Future Plans 8/89/1694:  METABOLIC PANEL, BASIC (50388) - one time 07/03/2016: CBC & PLATELETS (AUTO) (82800) - one time 07/03/2016: PT (PROTHROMBIN TIME) (34917) - one time Shortness of breath on exertion (R06.02) Echocardiogram 07/05/2016: Left ventricle cavity is normal in size. Mild concentric hypertrophy of  the left ventricle. Normal global wall motion. Doppler evidence of grade I (impaired) diastolic dysfunction. Calculated EF 67%. Left atrial cavity is normal in size. 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 Mild tricuspid regurgitation. No evidence of pulmonary hypertension. 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 I discussed primary/secondary prevention. He presents for follow up of nuclear stress test for symptoms suggestive of angina. Stress test reveals a moderate to large size severe inferior, inferoseptal, and inferoapical severe ischemia. He continues to have chest pain when he exerts himself significantly despite medical therapy. Given ongoing symptoms and abnormal stress test, will schedule for cardiac catheterization, and possible angioplasty. We discussed regarding risks, benefits, alternatives to this including stress testing, CTA and continued medical therapy. Patient wants to proceed. Understands <1-2% risk of death, stroke, MI, urgent CABG, bleeding, infection, renal failure but not limited to these. Follow-up after cath for reevaluation and further recommendations.  *I have discussed this case with Dr. Einar Gip and he personally examined the patient and participated in formulating the plan.*  Addendum Note(Bridgette Allison AGNP-C; 07/06/2016 10:41 AM) 07/05/2016: Creatinine 1.04, potassium 4.5, CBC normal, PT/INR normal  Labs stable for coronary angiogram.   Signed by Noah Campbell, AGNP-C (06/30/2016 8:06 AM)

## 2016-07-11 ENCOUNTER — Ambulatory Visit (HOSPITAL_COMMUNITY)
Admission: RE | Admit: 2016-07-11 | Discharge: 2016-07-12 | Disposition: A | Discharge status: Home / Self Care | Payer: Medicare Other | Source: Ambulatory Visit | Attending: Cardiology | Admitting: Cardiology

## 2016-07-11 ENCOUNTER — Encounter (HOSPITAL_COMMUNITY)
Admission: RE | Disposition: A | Discharge status: Home / Self Care | Payer: Self-pay | Source: Ambulatory Visit | Attending: Cardiology

## 2016-07-11 ENCOUNTER — Encounter (HOSPITAL_COMMUNITY): Payer: Self-pay | Admitting: General Practice

## 2016-07-11 DIAGNOSIS — I25119 Atherosclerotic heart disease of native coronary artery with unspecified angina pectoris: Secondary | ICD-10-CM | POA: Insufficient documentation

## 2016-07-11 DIAGNOSIS — Z7982 Long term (current) use of aspirin: Secondary | ICD-10-CM | POA: Diagnosis not present

## 2016-07-11 DIAGNOSIS — K5900 Constipation, unspecified: Secondary | ICD-10-CM | POA: Insufficient documentation

## 2016-07-11 DIAGNOSIS — Z87891 Personal history of nicotine dependence: Secondary | ICD-10-CM | POA: Diagnosis not present

## 2016-07-11 DIAGNOSIS — Z955 Presence of coronary angioplasty implant and graft: Secondary | ICD-10-CM | POA: Diagnosis not present

## 2016-07-11 DIAGNOSIS — Z7902 Long term (current) use of antithrombotics/antiplatelets: Secondary | ICD-10-CM | POA: Diagnosis not present

## 2016-07-11 DIAGNOSIS — E785 Hyperlipidemia, unspecified: Secondary | ICD-10-CM | POA: Insufficient documentation

## 2016-07-11 DIAGNOSIS — Z9861 Coronary angioplasty status: Secondary | ICD-10-CM

## 2016-07-11 DIAGNOSIS — Z9049 Acquired absence of other specified parts of digestive tract: Secondary | ICD-10-CM | POA: Insufficient documentation

## 2016-07-11 DIAGNOSIS — R197 Diarrhea, unspecified: Secondary | ICD-10-CM | POA: Diagnosis not present

## 2016-07-11 DIAGNOSIS — Z79899 Other long term (current) drug therapy: Secondary | ICD-10-CM | POA: Insufficient documentation

## 2016-07-11 DIAGNOSIS — I209 Angina pectoris, unspecified: Secondary | ICD-10-CM | POA: Diagnosis present

## 2016-07-11 HISTORY — PX: CORONARY ANGIOPLASTY WITH STENT PLACEMENT: SHX49

## 2016-07-11 HISTORY — DX: Pure hypercholesterolemia, unspecified: E78.00

## 2016-07-11 HISTORY — PX: CARDIAC CATHETERIZATION: SHX172

## 2016-07-11 HISTORY — DX: Atherosclerotic heart disease of native coronary artery without angina pectoris: I25.10

## 2016-07-11 HISTORY — DX: Unspecified osteoarthritis, unspecified site: M19.90

## 2016-07-11 LAB — POCT ACTIVATED CLOTTING TIME
ACTIVATED CLOTTING TIME: 279 s
Activated Clotting Time: 246 seconds

## 2016-07-11 SURGERY — LEFT HEART CATH AND CORONARY ANGIOGRAPHY

## 2016-07-11 MED ORDER — ASPIRIN 81 MG PO CHEW
81.0000 mg | CHEWABLE_TABLET | ORAL | Status: AC
  Administered 2016-07-11: 81 mg via ORAL

## 2016-07-11 MED ORDER — SODIUM CHLORIDE 0.9 % IV SOLN: 250.0000 mL | INTRAVENOUS | Status: DC | PRN

## 2016-07-11 MED ORDER — HEPARIN SODIUM (PORCINE) 1000 UNIT/ML IJ SOLN
INTRAMUSCULAR | Status: AC
  Filled 2016-07-11: qty 1

## 2016-07-11 MED ORDER — LIDOCAINE HCL (PF) 1 % IJ SOLN
INTRAMUSCULAR | Status: AC
  Filled 2016-07-11: qty 30

## 2016-07-11 MED ORDER — HEPARIN SODIUM (PORCINE) 1000 UNIT/ML IJ SOLN
INTRAMUSCULAR | Status: DC | PRN
  Administered 2016-07-11: 3000 [IU] via INTRAVENOUS
  Administered 2016-07-11: 4000 [IU] via INTRAVENOUS
  Administered 2016-07-11: 6000 [IU] via INTRAVENOUS
  Administered 2016-07-11: 2000 [IU] via INTRAVENOUS

## 2016-07-11 MED ORDER — LIDOCAINE HCL (PF) 1 % IJ SOLN
INTRAMUSCULAR | Status: DC | PRN
  Administered 2016-07-11: 5 mL via INTRA_ARTERIAL

## 2016-07-11 MED ORDER — CARVEDILOL 3.125 MG PO TABS
3.1250 mg | ORAL_TABLET | Freq: Two times a day (BID) | ORAL | Status: DC
  Administered 2016-07-11 – 2016-07-12 (×2): 3.125 mg via ORAL
  Filled 2016-07-11 (×2): qty 1

## 2016-07-11 MED ORDER — MIDAZOLAM HCL 2 MG/2ML IJ SOLN
INTRAMUSCULAR | Status: DC | PRN
  Administered 2016-07-11: 2 mg via INTRAVENOUS

## 2016-07-11 MED ORDER — SODIUM CHLORIDE 0.9 % WEIGHT BASED INFUSION: 1.0000 mL/kg/h | INTRAVENOUS | Status: DC

## 2016-07-11 MED ORDER — LIDOCAINE HCL (PF) 1 % IJ SOLN
INTRAMUSCULAR | Status: DC | PRN
  Administered 2016-07-11: 2 mL

## 2016-07-11 MED ORDER — NITROGLYCERIN 0.4 MG SL SUBL
SUBLINGUAL_TABLET | SUBLINGUAL | Status: AC
  Administered 2016-07-11: 18:00:00 0.4 mg
  Filled 2016-07-11: qty 1

## 2016-07-11 MED ORDER — HYDROMORPHONE HCL 1 MG/ML IJ SOLN
INTRAMUSCULAR | Status: AC
  Filled 2016-07-11: qty 1

## 2016-07-11 MED ORDER — HEPARIN (PORCINE) IN NACL 2-0.9 UNIT/ML-% IJ SOLN
INTRAMUSCULAR | Status: AC
  Filled 2016-07-11: qty 1000

## 2016-07-11 MED ORDER — SODIUM CHLORIDE 0.9% FLUSH: 3.0000 mL | INTRAVENOUS | Status: DC | PRN

## 2016-07-11 MED ORDER — AMLODIPINE BESYLATE 5 MG PO TABS
2.5000 mg | ORAL_TABLET | Freq: Every day | ORAL | Status: DC
  Administered 2016-07-12: 2.5 mg via ORAL
  Filled 2016-07-11: qty 1

## 2016-07-11 MED ORDER — IOPAMIDOL (ISOVUE-370) INJECTION 76%
INTRAVENOUS | Status: AC
  Filled 2016-07-11: qty 100

## 2016-07-11 MED ORDER — HEPARIN (PORCINE) IN NACL 2-0.9 UNIT/ML-% IJ SOLN
INTRAMUSCULAR | Status: DC | PRN
  Administered 2016-07-11: 1000 mL via INTRA_ARTERIAL

## 2016-07-11 MED ORDER — IOPAMIDOL (ISOVUE-370) INJECTION 76%
INTRAVENOUS | Status: DC | PRN
  Administered 2016-07-11: 245 mL via INTRA_ARTERIAL

## 2016-07-11 MED ORDER — CLOPIDOGREL BISULFATE 75 MG PO TABS
75.0000 mg | ORAL_TABLET | Freq: Every day | ORAL | Status: DC
  Administered 2016-07-12: 11:00:00 75 mg via ORAL
  Filled 2016-07-11: qty 1

## 2016-07-11 MED ORDER — ONDANSETRON HCL 4 MG/2ML IJ SOLN: 4.0000 mg | Freq: Four times a day (QID) | INTRAMUSCULAR | Status: DC | PRN

## 2016-07-11 MED ORDER — SODIUM CHLORIDE 0.9 % WEIGHT BASED INFUSION
3.0000 mL/kg/h | INTRAVENOUS | Status: DC
  Administered 2016-07-11: 3 mL/kg/h via INTRAVENOUS

## 2016-07-11 MED ORDER — MIDAZOLAM HCL 2 MG/2ML IJ SOLN
INTRAMUSCULAR | Status: AC
  Filled 2016-07-11: qty 2

## 2016-07-11 MED ORDER — NITROGLYCERIN 1 MG/10 ML FOR IR/CATH LAB
INTRA_ARTERIAL | Status: DC | PRN
  Administered 2016-07-11 (×2): 200 ug

## 2016-07-11 MED ORDER — IOPAMIDOL (ISOVUE-370) INJECTION 76%
INTRAVENOUS | Status: AC
  Filled 2016-07-11: qty 50

## 2016-07-11 MED ORDER — HYDROMORPHONE HCL 1 MG/ML IJ SOLN
INTRAMUSCULAR | Status: DC | PRN
  Administered 2016-07-11 (×2): 0.5 mg via INTRAVENOUS

## 2016-07-11 MED ORDER — SODIUM CHLORIDE 0.9 % WEIGHT BASED INFUSION: 3.0000 mL/kg/h | INTRAVENOUS | Status: AC

## 2016-07-11 MED ORDER — CLOPIDOGREL BISULFATE 300 MG PO TABS
ORAL_TABLET | ORAL | Status: DC | PRN
  Administered 2016-07-11: 600 mg via ORAL

## 2016-07-11 MED ORDER — SODIUM CHLORIDE 0.9% FLUSH
3.0000 mL | Freq: Two times a day (BID) | INTRAVENOUS | Status: DC
  Administered 2016-07-11: 18:00:00 3 mL via INTRAVENOUS

## 2016-07-11 MED ORDER — OXYCODONE HCL 5 MG PO TABS
5.0000 mg | ORAL_TABLET | Freq: Four times a day (QID) | ORAL | Status: DC | PRN
  Administered 2016-07-12: 01:00:00 5 mg via ORAL
  Filled 2016-07-11: qty 1

## 2016-07-11 MED ORDER — ASPIRIN EC 81 MG PO TBEC
81.0000 mg | DELAYED_RELEASE_TABLET | Freq: Every day | ORAL | Status: DC
  Administered 2016-07-12: 81 mg via ORAL
  Filled 2016-07-11: qty 1

## 2016-07-11 MED ORDER — CLOPIDOGREL BISULFATE 300 MG PO TABS
ORAL_TABLET | ORAL | Status: AC
  Filled 2016-07-11: qty 2

## 2016-07-11 MED ORDER — FAMOTIDINE 20 MG PO TABS
40.0000 mg | ORAL_TABLET | Freq: Two times a day (BID) | ORAL | Status: DC
  Administered 2016-07-11 – 2016-07-12 (×2): 40 mg via ORAL
  Filled 2016-07-11 (×2): qty 2

## 2016-07-11 MED ORDER — NITROGLYCERIN 1 MG/10 ML FOR IR/CATH LAB
INTRA_ARTERIAL | Status: AC
  Filled 2016-07-11: qty 10

## 2016-07-11 MED ORDER — ROSUVASTATIN CALCIUM 20 MG PO TABS: 20.0000 mg | ORAL_TABLET | Freq: Every day | ORAL | Status: DC

## 2016-07-11 MED ORDER — VERAPAMIL HCL 2.5 MG/ML IV SOLN
INTRAVENOUS | Status: AC
  Filled 2016-07-11: qty 2

## 2016-07-11 MED ORDER — ASPIRIN 81 MG PO CHEW
CHEWABLE_TABLET | ORAL | Status: AC
  Filled 2016-07-11: qty 1

## 2016-07-11 MED ORDER — SODIUM CHLORIDE 0.9% FLUSH: 3.0000 mL | Freq: Two times a day (BID) | INTRAVENOUS | Status: DC

## 2016-07-11 MED ORDER — VITAMIN B-12 1000 MCG PO TABS
1000.0000 ug | ORAL_TABLET | Freq: Every day | ORAL | Status: DC
  Administered 2016-07-11 – 2016-07-12 (×2): 1000 ug via ORAL
  Filled 2016-07-11 (×2): qty 1

## 2016-07-11 SURGICAL SUPPLY — 23 items
BALLN ANGIOSCULPT RX 2.5X6 (BALLOONS) ×2
BALLN EMERGE MR 3.0X20 (BALLOONS) ×2
BALLN ~~LOC~~ EMERGE MR 3.5X15 (BALLOONS) ×2
BALLN ~~LOC~~ EMERGE MR 4.0X20 (BALLOONS) ×2
BALLOON ANGIOSCULPT RX 2.5X6 (BALLOONS) ×1 IMPLANT
BALLOON EMERGE MR 3.0X20 (BALLOONS) ×1 IMPLANT
BALLOON ~~LOC~~ EMERGE MR 3.5X15 (BALLOONS) ×1 IMPLANT
BALLOON ~~LOC~~ EMERGE MR 4.0X20 (BALLOONS) ×1 IMPLANT
CATH HEARTRAIL 6F IL3.5 (CATHETERS) ×2 IMPLANT
CATH OPTITORQUE JACKY 4.0 5F (CATHETERS) ×2 IMPLANT
CATH VISTA GUIDE 6FR JR4 SH (CATHETERS) ×2 IMPLANT
DEVICE RAD COMP TR BAND LRG (VASCULAR PRODUCTS) ×2 IMPLANT
GLIDESHEATH SLEND A-KIT 6F 20G (SHEATH) ×2 IMPLANT
KIT ENCORE 26 ADVANTAGE (KITS) ×2 IMPLANT
KIT HEART LEFT (KITS) ×2 IMPLANT
PACK CARDIAC CATHETERIZATION (CUSTOM PROCEDURE TRAY) ×2 IMPLANT
STENT PROMUS PREM MR 4.0X38 (Permanent Stent) ×2 IMPLANT
STENT SYNERGY DES 2.5X20 (Permanent Stent) ×2 IMPLANT
STENT SYNERGY DES 3.5X24 (Permanent Stent) ×2 IMPLANT
TRANSDUCER W/STOPCOCK (MISCELLANEOUS) ×2 IMPLANT
TUBING CIL FLEX 10 FLL-RA (TUBING) ×2 IMPLANT
WIRE HI TORQ BMW 190CM (WIRE) ×2 IMPLANT
WIRE SAFE-T 1.5MM-J .035X260CM (WIRE) ×2 IMPLANT

## 2016-07-11 NOTE — Interval H&P Note (Signed)
History and Physical Interval Note:  07/11/2016 11:05 AM  Noah Campbell  has presented today for surgery, with the diagnosis of cp - abnormal stress  The various methods of treatment have been discussed with the patient and family. After consideration of risks, benefits and other options for treatment, the patient has consented to  Procedure(s): Left Heart Cath and Coronary Angiography (N/A) and possible PCI as a surgical intervention .  The patient's history has been reviewed, patient examined, no change in status, stable for surgery.  I have reviewed the patient's chart and labs.  Questions were answered to the patient's satisfaction.   Ischemic Symptoms? CCS II (Slight limitation of ordinary activity) Anti-ischemic Medical Therapy? Maximal Medical Therapy (2 or more classes of medications) Non-invasive Test Results? High-risk stress test findings: cardiac mortality >3%/yr Prior CABG? No Previous CABG   Patient Information:   1-2V CAD, no prox LAD  A (8)  Indication: 19; Score: 8   Patient Information:   CTO of 1 vessel, no other CAD  A (7)  Indication: 29; Score: 7   Patient Information:   1V CAD with prox LAD  A (9)  Indication: 35; Score: 9   Patient Information:   2V-CAD with prox LAD  A (9)  Indication: 41; Score: 9   Patient Information:   3V-CAD without LMCA  A (9)  Indication: 47; Score: 9   Patient Information:   3V-CAD without LMCA With Abnormal LV systolic function  A (9)  Indication: 48; Score: 9   Patient Information:   LMCA-CAD  A (9)  Indication: 49; Score: 9   Patient Information:   2V-CAD with prox LAD PCI  A (7)  Indication: 62; Score: 7   Patient Information:   2V-CAD with prox LAD CABG  A (8)  Indication: 62; Score: 8   Patient Information:   3V-CAD without LMCA With Low CAD burden(i.e., 3 focal stenoses, low SYNTAX score) PCI  A (7)  Indication: 63; Score: 7   Patient Information:   3V-CAD without  LMCA With Low CAD burden(i.e., 3 focal stenoses, low SYNTAX score) CABG  A (9)  Indication: 63; Score: 9   Patient Information:   3V-CAD without LMCA E06c - Intermediate-high CAD burden (i.e., multiple diffuse lesions, presence of CTO, or high SYNTAX score) PCI  U (4)  Indication: 64; Score: 4   Patient Information:   3V-CAD without LMCA E06c - Intermediate-high CAD burden (i.e., multiple diffuse lesions, presence of CTO, or high SYNTAX score) CABG  A (9)  Indication: 64; Score: 9   Patient Information:   LMCA-CAD With Isolated LMCA stenosis  PCI  U (6)  Indication: 65; Score: 6   Patient Information:   LMCA-CAD With Isolated LMCA stenosis  CABG  A (9)  Indication: 65; Score: 9   Patient Information:   LMCA-CAD Additional CAD, low CAD burden (i.e., 1- to 2-vessel additional involvement, low SYNTAX score) PCI  U (5)  Indication: 66; Score: 5   Patient Information:   LMCA-CAD Additional CAD, low CAD burden (i.e., 1- to 2-vessel additional involvement, low SYNTAX score) CABG  A (9)  Indication: 66; Score: 9   Patient Information:   LMCA-CAD Additional CAD, intermediate-high CAD burden (i.e., 3-vessel involvement, presence of CTO, or high SYNTAX score) PCI  I (3)  Indication: 67; Score: 3   Patient Information:   LMCA-CAD Additional CAD, intermediate-high CAD burden (i.e., 3-vessel involvement, presence of CTO, or high SYNTAX score) CABG  A (9)  Indication: 67;  Score: 9  Noah Campbell

## 2016-07-11 NOTE — Care Management Note (Signed)
Case Management Note  Patient Details  Name: SYIER BROUSE MRN: [id number] Date of Birth: [birth year]  Subjective/Objective:   Patient s/p heart cath, future med is plavix, NCM will cont to follow for dc needs.                 Action/Plan:   Expected Discharge Date:                  Expected Discharge Plan:  Home/Self Care  In-House Referral:     Discharge planning Services  CM Consult  Post Acute Care Choice:    Choice offered to:     DME Arranged:    DME Agency:     HH Arranged:    HH Agency:     Status of Service:  In process, will continue to follow  If discussed at Long Length of Stay Meetings, dates discussed:    Additional Comments:  Zenon Mayo, RN 07/11/2016, 3:12 PM

## 2016-07-11 NOTE — Progress Notes (Signed)
TR BAND REMOVAL  LOCATION:    right radial  DEFLATED PER PROTOCOL:    Yes.    TIME BAND OFF / DRESSING APPLIED:    1915   SITE UPON ARRIVAL:    Level 0  SITE AFTER BAND REMOVAL:    Level 0  CIRCULATION SENSATION AND MOVEMENT:    Within Normal Limits   Yes.    COMMENTS:   TOLERATED PROCEDURE WELL 

## 2016-07-12 ENCOUNTER — Encounter (HOSPITAL_COMMUNITY): Payer: Self-pay | Admitting: Cardiology

## 2016-07-12 DIAGNOSIS — I25119 Atherosclerotic heart disease of native coronary artery with unspecified angina pectoris: Secondary | ICD-10-CM | POA: Diagnosis not present

## 2016-07-12 DIAGNOSIS — K5900 Constipation, unspecified: Secondary | ICD-10-CM | POA: Diagnosis not present

## 2016-07-12 DIAGNOSIS — Z955 Presence of coronary angioplasty implant and graft: Secondary | ICD-10-CM | POA: Diagnosis not present

## 2016-07-12 DIAGNOSIS — E785 Hyperlipidemia, unspecified: Secondary | ICD-10-CM | POA: Diagnosis not present

## 2016-07-12 LAB — BASIC METABOLIC PANEL
Anion gap: 6 (ref 5–15)
BUN: 13 mg/dL (ref 6–20)
CALCIUM: 8.7 mg/dL — AB (ref 8.9–10.3)
CHLORIDE: 108 mmol/L (ref 101–111)
CO2: 23 mmol/L (ref 22–32)
CREATININE: 1.05 mg/dL (ref 0.61–1.24)
GFR calc non Af Amer: >60 mL/min (ref >60)
GLUCOSE: 98 mg/dL (ref 65–99)
Potassium: 4 mmol/L (ref 3.5–5.1)
Sodium: 137 mmol/L (ref 135–145)

## 2016-07-12 LAB — CBC
HCT: 40.3 % (ref 39.0–52.0)
Hemoglobin: 13.5 g/dL (ref 13.0–17.0)
MCH: 31 pg (ref 26.0–34.0)
MCHC: 33.5 g/dL (ref 30.0–36.0)
MCV: 92.4 fL (ref 78.0–100.0)
PLATELETS: 138 10*3/uL — AB (ref 150–400)
RBC: 4.36 MIL/uL (ref 4.22–5.81)
RDW: 14.1 % (ref 11.5–15.5)
WBC: 8.8 10*3/uL (ref 4.0–10.5)

## 2016-07-12 MED ORDER — FAMOTIDINE 40 MG PO TABS: 40.0000 mg | ORAL_TABLET | Freq: Every day | ORAL | 0 refills | Status: DC

## 2016-07-12 MED ORDER — CLOPIDOGREL BISULFATE 75 MG PO TABS: 75.0000 mg | ORAL_TABLET | Freq: Every day | ORAL | 1 refills | Status: DC

## 2016-07-12 MED ORDER — METOPROLOL SUCCINATE ER 50 MG PO TB24: 50.0000 mg | ORAL_TABLET | Freq: Every day | ORAL | 1 refills | Status: DC

## 2016-07-12 NOTE — Discharge Summary (Signed)
Physician Discharge Summary  Patient ID: MALICAH LOEZA MRN: [id number] DOB/AGE: 22-May-1941 75 y.o.  Admit date: 07/11/2016 Discharge date: 07/12/2016  Primary Discharge Diagnosis Angina pectoris CAD native vessel  Secondary Discharge Diagnosis Hyperlipidemia  Significant Diagnostic Studies:  07/12/2016 1. Normal LV systolic function, EF [id number]. No wall motion abnormality. 2. Proximal LAD complex 95% stenosis, distal LAD 95% stenosis. S/P PTCA and stenting of the ostial/proximal LAD with implantation of a 3.5 x 24 mm Synergy, distal LAD 2.5 x 20 mm Synergy. 3. Proximal circumflex 50-60% stenosis. 4. High-grade proximal RCA 95% stenosis, mid segment diffusely disease, distal RCA mild to moderate disease. Large right coronary artery. S/P  Stenting of proximal and mid RCA with a long 4.0 x 38 mm Promus premier DES.  Hospital Course: Admitted to the hospital for elective coronary angiography, underwent successful angioplasty to the LAD and also right coronary artery. Felt stable for discharge the following morning. Patient did have chest discomfort in the evening after the procedure, immediately relieved with nitroglycerin. Labs normal, EKG no ischemia.   Recommendations on discharge:  I have discontinued amlodipine and carvedilol due to constipation and diarrhea respectively. Will switch him to metoprolol succinate 50 mg by mouth daily. Patient does been developing shoulder arthralgias, it may be related to Crestor, we'll keep an eye on it. He'll follow-up with me as previously scheduled.  Discharge Exam: Blood pressure 119/69, pulse 67, temperature 98.1 F (36.7 C), temperature source Oral, resp. rate 17, height 6' (1.829 m), weight 95.8 kg (211 lb 4.8 oz), SpO2 93 %.    General appearance: alert, cooperative, appears stated age and mildly obese Chest wall: no tenderness Cardio: regular rate and rhythm, S1, S2 normal, no murmur, click, rub or gallop GI: soft, non-tender; bowel sounds  normal; no masses,  no organomegaly Extremities: extremities normal, atraumatic, no cyanosis or edema Pulses: 2+ and symmetric Right radial arterial access site without any complications. Neurologic: Grossly normal Labs:   Lab Results  Component Value Date   WBC 8.8 07/12/2016   HGB 13.5 07/12/2016   HCT 40.3 07/12/2016   MCV 92.4 07/12/2016   PLT 138 (L) 07/12/2016    Recent Labs Lab 07/12/16 0523  NA 137  K 4.0  CL 108  CO2 23  BUN 13  CREATININE 1.05  CALCIUM 8.7*  GLUCOSE 98    Lipid Panel  No results found for: CHOL, TRIG, HDL, CHOLHDL, VLDL, LDLCALC  BNP (last 3 results) No results for input(s): BNP in the last 8760 hours.  HEMOGLOBIN A1C No results found for: HGBA1C, MPG  Cardiac Panel (last 3 results) No results for input(s): CKTOTAL, CKMB, TROPONINI, RELINDX in the last 8760 hours.  No results found for: CKTOTAL, CKMB, CKMBINDEX, TROPONINI   TSH No results for input(s): TSH in the last 8760 hours.  EKG: Normal sinus rhythm, left axis deviation. No evidence of ischemia. FOLLOW UP PLANS AND APPOINTMENTS    Medication List    STOP taking these medications   amLODipine 2.5 MG tablet Commonly known as:  NORVASC   carvedilol 3.125 MG tablet Commonly known as:  COREG     TAKE these medications   aspirin 81 MG tablet Take 81 mg by mouth daily.   clopidogrel 75 MG tablet Commonly known as:  PLAVIX Take 1 tablet (75 mg total) by mouth daily with breakfast.   famotidine 40 MG tablet Commonly known as:  PEPCID Take 1 tablet (40 mg total) by mouth daily.   GLUCOSAMINE COMPLEX PO Take 2 capsules  by mouth daily.   metoprolol succinate 50 MG 24 hr tablet Commonly known as:  TOPROL XL Take 1 tablet (50 mg total) by mouth daily.   rosuvastatin 20 MG tablet Commonly known as:  CRESTOR Take 20 mg by mouth daily.   vitamin B-12 1000 MCG tablet Commonly known as:  CYANOCOBALAMIN Take 1,000 mcg by mouth daily.         Adrian Prows,  MD 07/12/2016, 8:07 AM  Pager: [phone] Office: [phone] If no answer: [phone]

## 2016-07-12 NOTE — Progress Notes (Signed)
CARDIAC REHAB PHASE I   PRE:  Rate/Rhythm: 71 SR    BP: sitting 120/68    SaO2:   MODE:  Ambulation: 450 ft   POST:  Rate/Rhythm: 78 SR    BP: sitting 143/77     SaO2:   Tolerated well. Sts his knees limit his ex but is very active. Ed completed. Understands importance of Plavix/ASA. Will send referral for CRPII to [place], ACSM 07/12/2016 8:45 AM

## 2016-12-26 ENCOUNTER — Encounter (HOSPITAL_COMMUNITY): Payer: Self-pay | Admitting: Family Medicine

## 2016-12-26 NOTE — Progress Notes (Signed)
Mailed letter with Cardiac Rehab Program to pt ... KJ  °

## 2018-07-03 DIAGNOSIS — K439 Ventral hernia without obstruction or gangrene: Secondary | ICD-10-CM | POA: Insufficient documentation

## 2018-12-16 ENCOUNTER — Other Ambulatory Visit: Payer: Self-pay

## 2018-12-16 MED ORDER — FAMOTIDINE 40 MG PO TABS: 40.0000 mg | ORAL_TABLET | Freq: Every day | ORAL | 0 refills | Status: DC

## 2018-12-17 ENCOUNTER — Other Ambulatory Visit: Payer: Self-pay

## 2018-12-17 MED ORDER — FAMOTIDINE 40 MG PO TABS: 40.0000 mg | ORAL_TABLET | Freq: Every day | ORAL | 0 refills | Status: DC

## 2019-01-12 ENCOUNTER — Other Ambulatory Visit: Payer: Self-pay | Admitting: Cardiology

## 2019-01-17 ENCOUNTER — Other Ambulatory Visit: Payer: Self-pay

## 2019-01-17 MED ORDER — FAMOTIDINE 40 MG PO TABS: 40.0000 mg | ORAL_TABLET | Freq: Every day | ORAL | 2 refills | Status: DC

## 2019-07-04 ENCOUNTER — Other Ambulatory Visit: Payer: Self-pay | Admitting: Cardiology

## 2019-11-09 ENCOUNTER — Other Ambulatory Visit: Payer: Self-pay | Admitting: Cardiology

## 2019-12-30 ENCOUNTER — Other Ambulatory Visit: Payer: Self-pay | Admitting: Cardiology

## 2020-07-19 ENCOUNTER — Other Ambulatory Visit: Payer: Self-pay | Admitting: Cardiology

## 2020-10-14 ENCOUNTER — Other Ambulatory Visit: Payer: Self-pay | Admitting: Cardiology

## 2020-11-19 ENCOUNTER — Other Ambulatory Visit: Payer: Self-pay | Admitting: Cardiology

## 2020-12-23 ENCOUNTER — Other Ambulatory Visit: Payer: Self-pay | Admitting: Cardiology

## 2020-12-26 ENCOUNTER — Other Ambulatory Visit: Payer: Self-pay | Admitting: Cardiology

## 2021-07-18 ENCOUNTER — Other Ambulatory Visit: Payer: Self-pay

## 2021-07-18 ENCOUNTER — Encounter (HOSPITAL_BASED_OUTPATIENT_CLINIC_OR_DEPARTMENT_OTHER): Payer: Self-pay | Admitting: Emergency Medicine

## 2021-07-18 ENCOUNTER — Emergency Department (HOSPITAL_BASED_OUTPATIENT_CLINIC_OR_DEPARTMENT_OTHER)
Admission: EM | Admit: 2021-07-18 | Discharge: 2021-07-18 | Disposition: A | Discharge status: Home / Self Care | Payer: Medicare Other | Attending: Emergency Medicine | Admitting: Emergency Medicine

## 2021-07-18 DIAGNOSIS — Z7982 Long term (current) use of aspirin: Secondary | ICD-10-CM | POA: Insufficient documentation

## 2021-07-18 DIAGNOSIS — I2510 Atherosclerotic heart disease of native coronary artery without angina pectoris: Secondary | ICD-10-CM | POA: Insufficient documentation

## 2021-07-18 DIAGNOSIS — Z7902 Long term (current) use of antithrombotics/antiplatelets: Secondary | ICD-10-CM | POA: Diagnosis not present

## 2021-07-18 DIAGNOSIS — Z87891 Personal history of nicotine dependence: Secondary | ICD-10-CM | POA: Insufficient documentation

## 2021-07-18 DIAGNOSIS — K625 Hemorrhage of anus and rectum: Secondary | ICD-10-CM | POA: Diagnosis not present

## 2021-07-18 LAB — COMPREHENSIVE METABOLIC PANEL
ALT: 23 U/L (ref 0–44)
AST: 17 U/L (ref 15–41)
Albumin: 4 g/dL (ref 3.5–5.0)
Alkaline Phosphatase: 84 U/L (ref 38–126)
Anion gap: 9 (ref 5–15)
BUN: 25 mg/dL — ABNORMAL HIGH (ref 8–23)
CO2: 23 mmol/L (ref 22–32)
Calcium: 8.9 mg/dL (ref 8.9–10.3)
Chloride: 105 mmol/L (ref 98–111)
Creatinine, Ser: 0.97 mg/dL (ref 0.61–1.24)
GFR, Estimated: >60 mL/min (ref >60)
Glucose, Bld: 112 mg/dL — ABNORMAL HIGH (ref 70–99)
Potassium: 3.7 mmol/L (ref 3.5–5.1)
Sodium: 137 mmol/L (ref 135–145)
Total Bilirubin: 0.7 mg/dL (ref 0.3–1.2)
Total Protein: 7.1 g/dL (ref 6.5–8.1)

## 2021-07-18 LAB — OCCULT BLOOD X 1 CARD TO LAB, STOOL: Fecal Occult Bld: POSITIVE — AB

## 2021-07-18 LAB — CBC WITH DIFFERENTIAL/PLATELET
Abs Immature Granulocytes: 0.26 10*3/uL — ABNORMAL HIGH (ref 0.00–0.07)
Basophils Absolute: 0 10*3/uL (ref 0.0–0.1)
Basophils Relative: 0 %
Eosinophils Absolute: 0 10*3/uL (ref 0.0–0.5)
Eosinophils Relative: 0 %
HCT: 39.3 % (ref 39.0–52.0)
Hemoglobin: 13.5 g/dL (ref 13.0–17.0)
Immature Granulocytes: 2 %
Lymphocytes Relative: 7 %
Lymphs Abs: 1.1 10*3/uL (ref 0.7–4.0)
MCH: 32.3 pg (ref 26.0–34.0)
MCHC: 34.4 g/dL (ref 30.0–36.0)
MCV: 94 fL (ref 80.0–100.0)
Monocytes Absolute: 0.9 10*3/uL (ref 0.1–1.0)
Monocytes Relative: 7 %
Neutro Abs: 12 10*3/uL — ABNORMAL HIGH (ref 1.7–7.7)
Neutrophils Relative %: 84 %
Platelets: 260 10*3/uL (ref 150–400)
RBC: 4.18 MIL/uL — ABNORMAL LOW (ref 4.22–5.81)
RDW: 13.2 % (ref 11.5–15.5)
WBC: 14.3 10*3/uL — ABNORMAL HIGH (ref 4.0–10.5)
nRBC: 0.1 % (ref 0.0–0.2)

## 2021-07-18 NOTE — Discharge Instructions (Signed)
Please continue to monitor your symptoms closely.  If you develop persistent rectal bleeding, abdominal pain, fevers, lightheadedness, feelings of wanting to pass out, please come back to the emergency department immediately for reevaluation.  Below is the contact information for [facility] gastroenterology.  Please give them a call and schedule a follow-up appointment.  You will likely need to have a colonoscopy given that you have never had one obtained.  It was a pleasure to meet you and your wife.

## 2021-07-18 NOTE — ED Provider Notes (Signed)
[facility] EMERGENCY DEPT Provider Note   CSN: [id number] Arrival date & time: 07/18/21  1202     History Chief Complaint  Patient presents with   Rectal Bleeding    Noah Campbell is a 80 y.o. male.  HPI  Patient is an 80 year old male with a history of CAD, hyperlipidemia, cardiac catheterization on Plavix and aspirin, who presents to the emergency department due to rectal bleeding.  Patient states that he had 2 bowel movements this morning and noticed blood when wiping.  He states the first time there was enough blood to "soak through the toilet paper".  He states during the second bowel movements there was less blood.  Denied any bloody stools or blood within the toilet.  No melena.  Denies any rectal pain or pain with defecation.  No abdominal pain.  He does note that he was recently diagnosed with TMJ and states that he was regularly taking ibuprofen and then switched to prednisone about 6 days ago.  Denies any chest pain, shortness of breath, syncope, lightheadedness.  Does note a history of hemorrhoids about 25 years ago.  No history of similar symptoms.  Patient denies a personal history of cancer and states that he has never had a colonoscopy.     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   Varicose vein of leg    bilateral    Patient Active Problem List   Diagnosis Date Noted   Post PTCA 07/11/2016   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   Memory disorder 05/06/2015    Past Surgical History:  Procedure Laterality Date   CARDIAC CATHETERIZATION N/A 07/11/2016   Procedure: Left Heart Cath and Coronary Angiography;  Surgeon: Adrian Prows, MD;  Location: [facility];  Service: Cardiovascular;  Laterality: N/A;   CORONARY ANGIOPLASTY WITH STENT PLACEMENT  07/11/2016   "3 stents"   LAPAROSCOPIC CHOLECYSTECTOMY  2014       Family History  Problem Relation Age of Onset   Colon cancer Mother    Prostate cancer Father    Heart attack Father    Pancreatic cancer Brother     Social History   Tobacco Use   Smoking status: Former    Packs/day: 1.00    Years: 17.00    Pack years: 17.00    Types: Cigarettes    Quit date: 11/14/1971    Years since quitting: 49.7   Smokeless tobacco: Never  Substance Use Topics   Alcohol use: No   Drug use: No    Home Medications Prior to Admission medications   Medication Sig Start Date End Date Taking? Authorizing Provider  predniSONE (STERAPRED UNI-PAK 21 TAB) 10 MG (21) TBPK tablet Take by mouth daily.   Yes [provider]  aspirin 81 MG tablet Take 81 mg by mouth daily.    [provider]  atorvastatin (LIPITOR) 40 MG tablet TAKE 1 TABLET BY  MOUTH  DAILY 07/20/20   Adrian Prows, MD  Boswellia-Glucosamine-Vit D (GLUCOSAMINE COMPLEX PO) Take 2 capsules by mouth daily.     [provider]  clopidogrel (PLAVIX) 75 MG tablet TAKE 1 TABLET BY MOUTH  DAILY 11/10/19   Adrian Prows, MD  famotidine (PEPCID) 40 MG tablet Take 1 tablet (40 mg total) by mouth daily. 01/17/19   Adrian Prows, MD  metoprolol succinate (TOPROL-XL) 50 MG 24 hr tablet TAKE 1 TABLET BY MOUTH  DAILY 12/27/20   Adrian Prows, MD  rosuvastatin (CRESTOR) 20 MG tablet Take 20 mg by mouth daily.    [provider]  vitamin B-12 (CYANOCOBALAMIN) 1000 MCG tablet Take 1,000 mcg by mouth daily.    [provider]     Allergies    Tylenol [acetaminophen] and Aleve [naproxen sodium]  Review of Systems   Review of Systems  All other systems reviewed and are negative. Ten systems reviewed and are negative for acute change, except as noted in the HPI.   Physical Exam Updated Vital Signs BP (!) 155/82 (BP Location: Right Arm)   Pulse 62   Resp 20   Ht '5\' 11"'$  (1.803 m)   Wt 106.6 kg   SpO2 98%   BMI 32.78 kg/m   Physical Exam Vitals and nursing note reviewed.  Constitutional:      General: He is not in acute distress.    Appearance: Normal appearance. He is not ill-appearing, toxic-appearing or diaphoretic.  HENT:     Head: Normocephalic and atraumatic.     Right Ear: External ear normal.     Left Ear: External ear normal.     Nose: Nose normal.     Mouth/Throat:     Mouth: Mucous membranes are moist.     Pharynx: Oropharynx is clear. No oropharyngeal exudate or posterior oropharyngeal erythema.  Eyes:     General: No scleral icterus.       Right eye: No discharge.        Left eye: No discharge.     Extraocular Movements: Extraocular movements intact.     Conjunctiva/sclera: Conjunctivae normal.  Cardiovascular:     Rate and Rhythm: Normal rate and regular rhythm.     Pulses: Normal pulses.     Heart sounds: Normal heart sounds. No murmur heard.   No friction rub. No gallop.  Pulmonary:     Effort: Pulmonary effort is normal. No respiratory distress.     Breath sounds: Normal breath sounds. No stridor. No wheezing, rhonchi or rales.  Abdominal:     General: Abdomen is flat.     Palpations: Abdomen is soft.     Tenderness: There is no abdominal tenderness.     Comments: Protuberant abdomen that is soft and nontender in all 4 quadrants.  Genitourinary:    Comments: Nursing chaperone present.  Normal-appearing anal region.  No visible or palpable hemorrhoids noted.  Small amount of brown stool noted in the rectal vault.  No gross hematochezia.  No melena.  No tenderness appreciated  throughout the exam. Musculoskeletal:        General: Normal range of motion.     Cervical back: Normal range of motion and neck supple. No tenderness.  Skin:    General: Skin is warm and dry.  Neurological:     General: No focal deficit present.     Mental Status: He is alert and oriented to person, place, and time.  Psychiatric:        Mood and  Affect: Mood normal.        Behavior: Behavior normal.   ED Results / Procedures / Treatments   Labs (all labs ordered are listed, but only abnormal results are displayed) Labs Reviewed  COMPREHENSIVE METABOLIC PANEL - Abnormal; Notable for the following components:      Result Value   Glucose, Bld 112 (*)    BUN 25 (*)    All other components within normal limits  CBC WITH DIFFERENTIAL/PLATELET - Abnormal; Notable for the following components:   WBC 14.3 (*)    RBC 4.18 (*)    Neutro Abs 12.0 (*)    Abs Immature Granulocytes 0.26 (*)    All other components within normal limits  OCCULT BLOOD X 1 CARD TO LAB, STOOL    EKG None  Radiology No results found.  Procedures Procedures   Medications Ordered in ED Medications - No data to display  ED Course  I have reviewed the triage vital signs and the nursing notes.  Pertinent labs & imaging results that were available during my care of the patient were reviewed by me and considered in my medical decision making (see chart for details).  Clinical Course as of 07/18/21 1343  Mon Jul 18, 2021  1301 CBC with Differential(!) White blood cells of 14.3, neutrophils of 12, absolute immature granulocytes of 0.26.  Patient currently on a course of prednisone which is likely the cause of these findings. [LJ]  1306 Hemoglobin: 13.5 [LJ]    Clinical Course User Index [LJ] Rayna Sexton, PA-C   MDM Rules/Calculators/A&P                          Pt is a 80 y.o. male who presents to the emergency department due to 2 episodes of blood when wiping after bowel movements this  morning.  Labs: CBC with a white count of 14.3, RBCs of 4.18, neutrophils of 12, absolute immature granulocytes of 0.26. CMP with a glucose of 112 and a BUN of 25. Hemoccult is positive.  I, Rayna Sexton, PA-C, personally reviewed and evaluated these images and lab results as part of my medical decision-making.  Unsure the source of the patient's symptoms.  His physical exam was reassuring.  His abdomen is soft and nontender in all 4 quadrants.  Normal-appearing anal region with no visible or palpable hemorrhoids.  Hemoccult is positive but there was no hematochezia or melena noted on my exam.  Hemoglobin is within normal limits at 13.5.  No complaints of chest pain, shortness of breath, lightheadedness, syncope.  Patient does note that he has not ever had a colonoscopy.  Given his age and symptoms today we will give him a referral to gastroenterology and recommended that he follow-up with them as soon as possible.  Patient given strict return precautions and he understands to return to the emergency department with any new or worsening symptoms.  Feel the patient is stable for discharge at this time and he is agreeable.  His questions were answered and he was amicable at the time of discharge.  Patient discussed with and evaluated by my attending physician Dr. Pearline Cables who was in agreement with the above plan.  Note: Portions of this report may have been transcribed using voice recognition software. Every effort was made to ensure accuracy; however, inadvertent computerized transcription errors may be present.   Final Clinical Impression(s) / ED Diagnoses Final diagnoses:  Rectal bleeding   Rx / DC Orders ED Discharge  Orders     None        Rayna Sexton, PA-C 07/18/21 [place], DO 07/19/21 1419

## 2021-07-18 NOTE — ED Triage Notes (Signed)
Pt to ER with c/o bright red blood when wiping after 2 bowel movements this AM.  Pt denies any previous bleeding.  Pt denies n/v, abdominal pain or other s/s or c/o.

## 2021-08-03 ENCOUNTER — Ambulatory Visit: Payer: Medicare Other | Admitting: Cardiology

## 2021-08-10 ENCOUNTER — Encounter: Payer: Self-pay | Admitting: Cardiology

## 2021-08-10 ENCOUNTER — Other Ambulatory Visit: Payer: Self-pay

## 2021-08-10 ENCOUNTER — Ambulatory Visit: Payer: Medicare Other | Admitting: Cardiology

## 2021-08-10 VITALS — BP 124/68 | HR 71 | Temp 98.2°F | Resp 16 | Ht 71.0 in | Wt 232.4 lb

## 2021-08-10 DIAGNOSIS — Z01818 Encounter for other preprocedural examination: Secondary | ICD-10-CM

## 2021-08-10 DIAGNOSIS — R0609 Other forms of dyspnea: Secondary | ICD-10-CM

## 2021-08-10 DIAGNOSIS — E7800 Pure hypercholesterolemia, unspecified: Secondary | ICD-10-CM

## 2021-08-10 DIAGNOSIS — R0600 Dyspnea, unspecified: Secondary | ICD-10-CM

## 2021-08-10 DIAGNOSIS — I25118 Atherosclerotic heart disease of native coronary artery with other forms of angina pectoris: Secondary | ICD-10-CM

## 2021-08-10 NOTE — Patient Instructions (Signed)
Please obtain blood work in the next few days on empty stomach, please go to any [facility].  You are set up for a echocardiogram which is the ultrasound of the heart and also a nuclear stress test to evaluate for any blockages.  With regard to colonoscopy can have it any day with low risk.  I would like to see him back in 6 weeks.

## 2021-08-10 NOTE — Progress Notes (Signed)
Primary Physician/Referring:  [name]  Patient ID: Noah Campbell, male    DOB: 1941-08-01, 80 y.o.   MRN: [id number]  Chief Complaint  Patient presents with   Pre-op Exam    Medical Clearance   New Patient (Initial Visit)   HPI:    Noah Campbell  is a 80 y.o. Caucasian male patient with coronary artery disease with stable angina pectoris, history of coronary angioplasty proximal LAD and distal LAD and mid RCA on 07/11/2016.  Past medical history significant for hyperlipidemia and chronic dyspnea.  I had last seen him in 2018.   He is referred back to me for preoperative cardiac restratification, he is planning on having bilateral knee replacement and also recently was seen in the emergency room on 07/18/2021 with rectal bleed but not felt to be significant and was discharged with outpatient follow-up recommendation.  He has noticed recurrence of shortness of breath over the past several months.  States that he feels like it was prior to his angioplasty.  He has not had any chest pain.  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   Varicose vein of leg    bilateral   Past Surgical History:  Procedure Laterality Date   CARDIAC CATHETERIZATION N/A 07/11/2016   Procedure: Left Heart Cath and Coronary Angiography;  Surgeon: Adrian Prows, MD;  Location: [facility];  Service: Cardiovascular;  Laterality: N/A;    CORONARY ANGIOPLASTY WITH STENT PLACEMENT  07/11/2016   "3 stents"   LAPAROSCOPIC CHOLECYSTECTOMY  2014   Family History  Problem Relation Age of Onset   Colon cancer Mother    Prostate cancer Father    Heart attack Father    Pancreatic cancer Brother     Social History   Tobacco Use   Smoking status: Former    Packs/day: 1.00    Years: 17.00    Pack years: 17.00    Types: Cigarettes    Quit date: 11/14/1971    Years since quitting: 49.7   Smokeless tobacco: Never  Substance Use Topics   Alcohol use: No   Marital Status: Married  ROS  Review of Systems  Cardiovascular:  Positive for dyspnea on exertion. Negative for chest pain and leg swelling.  Musculoskeletal:  Positive for arthritis and joint pain (bilateral knee).  Gastrointestinal:  Positive for hematochezia (Has not recurred since ED evaluation). Negative for abdominal pain and melena.  Objective  Blood pressure 124/68, pulse 71, temperature 98.2 F (36.8 C), temperature source Temporal, resp. rate 16, height 5' 11" (1.803 m), weight 232 lb 6.4 oz (105.4 kg), SpO2 97 %. Body mass index is 32.41 kg/m.  Vitals with BMI 08/10/2021 07/18/2021 07/18/2021  Height 5' 11" - 5' 11"  Weight 232 lbs 6 oz - 235 lbs  BMI 42.35 - 36.14  Systolic 431 540 086  Diastolic 68 66 82  Pulse 71 48 62     Physical Exam  Constitutional:      Appearance: He is obese.  Neck:     Vascular: No carotid bruit or JVD.  Cardiovascular:     Rate and Rhythm: Normal rate and regular rhythm.     Pulses: Intact distal pulses.     Heart sounds: Normal heart sounds. No murmur heard.   No gallop.  Pulmonary:     Effort: Pulmonary effort is normal.     Breath sounds: Normal breath sounds.  Abdominal:     General: Bowel sounds are normal.     Palpations: Abdomen is soft.     Hernia: A hernia is present. Hernia is present in the ventral area.  Musculoskeletal:        General: No swelling.     Laboratory examination:   Recent Labs     07/18/21 1237  NA 137  K 3.7  CL 105  CO2 23  GLUCOSE 112*  BUN 25*  CREATININE 0.97  CALCIUM 8.9  GFRNONAA >60   CrCl cannot be calculated (Patient's most recent lab result is older than the maximum 21 days allowed.).  CMP Latest Ref Rng & Units 07/18/2021 07/12/2016 06/28/2009  Glucose 70 - 99 mg/dL 112(H) 98 100(H)  BUN 8 - 23 mg/dL 25(H) 13 11  Creatinine 0.61 - 1.24 mg/dL 0.97 1.05 0.89  Sodium 135 - 145 mmol/L 137 137 137  Potassium 3.5 - 5.1 mmol/L 3.7 4.0 4.2  Chloride 98 - 111 mmol/L 105 108 105  CO2 22 - 32 mmol/L _0 Calcium 8.9 - 10.3 mg/dL 8.9 8.7(L) 9.4  Total Protein 6.5 - 8.1 g/dL 7.1 - 6.9  Total Bilirubin 0.3 - 1.2 mg/dL 0.7 - 1.0  Alkaline Phos 38 - 126 U/L 84 - 65  AST 15 - 41 U/L 17 - 29  ALT 0 - 44 U/L 23 - 15   CBC Latest Ref Rng & Units 07/18/2021 07/12/2016 06/28/2009  WBC 4.0 - 10.5 K/uL 14.3(H) 8.8 5.9  Hemoglobin 13.0 - 17.0 g/dL 13.5 13.5 14.1  Hematocrit 39.0 - 52.0 % 39.3 40.3 40.1  Platelets 150 - 400 K/uL 260 138(L) 189    Lipid Panel No results for input(s): CHOL, TRIG, LDLCALC, VLDL, HDL, CHOLHDL, LDLDIRECT in the last 8760 hours. Lipid Panel  No results found for: CHOL, TRIG, HDL, CHOLHDL, VLDL, LDLCALC, LDLDIRECT, LABVLDL   HEMOGLOBIN A1C No results found for: HGBA1C, MPG TSH No results for input(s): TSH in the last 8760 hours.  External labs:    Medications and allergies   Allergies  Allergen Reactions   Tylenol [Acetaminophen] Other (See Comments)    dizziness   Aleve [Naproxen Sodium] Other (See Comments)    Dizziness      Medication prior to this encounter:   Outpatient Medications Prior to Visit  Medication Sig Dispense Refill   aspirin 81 MG tablet Take 81 mg by mouth daily.     atorvastatin (LIPITOR) 40 MG tablet TAKE 1 TABLET BY MOUTH  DAILY 90 tablet 0   Boswellia-Glucosamine-Vit D (GLUCOSAMINE COMPLEX PO) Take 2 capsules by mouth daily.      famotidine (PEPCID) 40 MG tablet Take 1 tablet (40 mg total) by  mouth daily. 90 tablet 2   metoprolol succinate (TOPROL-XL) 50 MG 24 hr tablet TAKE 1 TABLET BY MOUTH  DAILY 90 tablet 3   vitamin B-12 (CYANOCOBALAMIN) 1000 MCG tablet Take 1,000 mcg by mouth daily.     clopidogrel (PLAVIX) 75 MG tablet TAKE 1 TABLET BY MOUTH  DAILY  90 tablet 3   predniSONE (STERAPRED UNI-PAK 21 TAB) 10 MG (21) TBPK tablet Take by mouth daily.     rosuvastatin (CRESTOR) 20 MG tablet Take 20 mg by mouth daily.     No facility-administered medications prior to visit.    Medication list after today's encounter   Current Outpatient Medications  Medication Instructions   aspirin 81 mg, Oral, Daily   atorvastatin (LIPITOR) 40 MG tablet TAKE 1 TABLET BY MOUTH  DAILY   Boswellia-Glucosamine-Vit D (GLUCOSAMINE COMPLEX PO) 2 capsules, Oral, Daily   famotidine (PEPCID) 40 mg, Oral, Daily   metoprolol succinate (TOPROL-XL) 50 MG 24 hr tablet TAKE 1 TABLET BY MOUTH  DAILY   vitamin B-12 (CYANOCOBALAMIN) 1,000 mcg, Oral, Daily    Radiology:   No results found.  Cardiac Studies:   Exercise nuclear stress test 06/23/2016: 1. The resting electrocardiogram demonstrated normal sinus rhythm, normal resting conduction and no resting arrhythmias.  Stress EKG was positive for my cardiac ischemia. There was 2 mm ST segment depression in the inferior and lateral leads and 1 mm ST elevation in aVR with peak exercise, which resolved at 1 minute into recovery however there was the appearance of ST segment changes at 2 minutes into recovery.  This is strongly positive for myocardial ischemia.  Patient exercised on a Bruce protocol for 7:30 minutes and achieved 9.2 MET S.  Stress terminated due to dyspnea.  Normal blood pressure response. 2. Perfusion imaging study demonstrates a moderate to large sized severe inferior. infero-septal and infero-apical severe ischemia. Left ventricular systolic function was normal at 56%.  This is a high risk study, consider further cardiac workup.  Coronary  angiogram 07/11/2016:  Normal LVEF. Prox LAD 95% S/P 3.5x24 Synergy DES. Distal LAD 95% S/P 2.5x20 Syncergy DES. Prox RCA 95% S/P 4.0x38 mm Promus DES. Prox Cx 60%.  Echo 12/27/2016:  Normal LV and RV systolic function with no gross segmental wall motion abnormalities.  LVEF 62%.  Stage I diastolic dysfunction.  Mild concentric LVH.  Dilated LV.  Mild tricuspid regurgitation.  Normal IVC size.  Upper normal RV systolic pressure 35 mmHg.  EKG:   EKG 08/10/2021: Normal sinus rhythm at rate of 60 bpm, left axis deviation, left anterior fascicular block.  Incomplete right bundle branch block.  No significant change from 06/28/2017.  Assessment     ICD-10-CM   1. Pre-op evaluation contemplating knee surgery and also needs colonoscopy  Z01.818 EKG 12-Lead    2. Coronary artery disease involving native coronary artery of native heart with other form of angina pectoris ([place])  I25.118 CMP14+EGFR    TSH    PCV ECHOCARDIOGRAM COMPLETE    PCV MYOCARDIAL PERFUSION WITH LEXISCAN    3. Dyspnea on exertion  R06.00 PCV ECHOCARDIOGRAM COMPLETE    PCV MYOCARDIAL PERFUSION WITH LEXISCAN    Brain natriuretic peptide    4. Pure hypercholesterolemia  E78.00 Lipid Panel With LDL/HDL Ratio       Medications Discontinued During This Encounter  Medication Reason   rosuvastatin (CRESTOR) 20 MG tablet Error   predniSONE (STERAPRED UNI-PAK 21 TAB) 10 MG (21) TBPK tablet Error   clopidogrel (PLAVIX) 75 MG tablet Completed Course    No orders of the defined types were placed in this encounter.  Orders Placed This Encounter  Procedures   Lipid Panel With LDL/HDL Ratio   CMP14+EGFR   TSH   Brain natriuretic peptide   PCV MYOCARDIAL PERFUSION WITH LEXISCAN    Standing Status:   Future  Standing Expiration Date:   10/10/2021   EKG 12-Lead   PCV ECHOCARDIOGRAM COMPLETE    Standing Status:   Future    Standing Expiration Date:   08/10/2022   Recommendations:   Noah Campbell is a 80 y.o. Caucasian male  patient with coronary artery disease with stable angina pectoris, history of coronary angioplasty proximal LAD and distal LAD and mid RCA on 07/11/2016.  Past medical history significant for hyperlipidemia and chronic dyspnea.  I had last seen him in 2018.  He presents to reestablish care as he needs colonoscopy and preoperative cardiac risk stratification.  Patient has developed anginal equivalent dyspnea, it has been >5 years since last stress test, we will schedule him for Lexiscan nuclear stress test as he is unable to walk the treadmill due to bilateral degenerative joint disease.  No clinical evidence of heart failure however I will obtain a BNP. Blood pressure is well controlled.  With regard to coronary artery disease, will discontinue Plavix.  Continue aspirin indefinitely.  He has not had any labs including lipids, will obtain routine labs, CBC was recently performed which showed very mild anemia but otherwise normal.  I would like to see him back in 6 weeks for follow-up.  He lives in [place] for 6 months and travels to [place] for 6 months.  He is accompanied by his wife today.  From colonoscopy standpoint, do not see any contraindication to proceed.  Hopefully discontinuing his Plavix will help with mild GI bleed that he is having, appears to be most probably a hemorrhoid.  However for his knee replacement, I would like to further stratify him with stress testing first.    Adrian Prows, MD, [facility] 08/10/2021, 5:16 PM Office: [phone]

## 2021-08-17 ENCOUNTER — Ambulatory Visit: Payer: Medicare Other | Admitting: Cardiology

## 2021-08-31 ENCOUNTER — Ambulatory Visit: Payer: Medicare Other

## 2021-08-31 ENCOUNTER — Other Ambulatory Visit: Payer: Self-pay

## 2021-08-31 DIAGNOSIS — I25118 Atherosclerotic heart disease of native coronary artery with other forms of angina pectoris: Secondary | ICD-10-CM

## 2021-08-31 DIAGNOSIS — R0609 Other forms of dyspnea: Secondary | ICD-10-CM

## 2021-09-01 ENCOUNTER — Other Ambulatory Visit: Payer: Medicare Other

## 2021-09-01 LAB — PCV MYOCARDIAL PERFUSION WITH LEXISCAN: ST Depression (mm): 0 mm

## 2021-09-05 ENCOUNTER — Other Ambulatory Visit: Payer: Medicare Other

## 2021-09-07 ENCOUNTER — Other Ambulatory Visit: Payer: Medicare Other

## 2021-09-08 NOTE — Progress Notes (Signed)
He needs surgery and I do not know what surgery to send letter for okay. Please ask patient

## 2021-09-09 NOTE — Progress Notes (Signed)
Called pt, he is suppose to be getting a colonoscopy. Dr. Laverta Baltimore stated has to wait 6 weeks to get it done because he had covid. He also will be in [place] for the winter.He will give [place] a call if and when he decides to schedule the procedure.

## 2021-09-10 NOTE — Progress Notes (Signed)
Let him know the echo and stress test are low risk. He may have the procedure anytime

## 2021-09-13 NOTE — Progress Notes (Signed)
Called and spoke to pt, pt voiced understanding.

## 2021-09-14 ENCOUNTER — Encounter: Payer: Self-pay | Admitting: Cardiology

## 2021-09-15 ENCOUNTER — Ambulatory Visit: Payer: Medicare Other

## 2021-09-15 ENCOUNTER — Other Ambulatory Visit: Payer: Self-pay

## 2021-09-22 NOTE — Progress Notes (Signed)
Called and spoke with patient wife regarding his echocardiogram results.  

## 2021-12-16 ENCOUNTER — Other Ambulatory Visit: Payer: Self-pay | Admitting: Cardiology

## 2022-03-15 ENCOUNTER — Ambulatory Visit: Payer: Medicare Other | Admitting: Cardiology

## 2022-03-21 ENCOUNTER — Encounter: Payer: Self-pay | Admitting: Cardiology

## 2022-03-21 ENCOUNTER — Ambulatory Visit: Payer: Medicare Other | Admitting: Cardiology

## 2022-03-21 VITALS — BP 133/78 | HR 79 | Temp 98.9°F | Resp 16 | Ht 71.0 in | Wt 234.2 lb

## 2022-03-21 DIAGNOSIS — R0609 Other forms of dyspnea: Secondary | ICD-10-CM

## 2022-03-21 DIAGNOSIS — I25118 Atherosclerotic heart disease of native coronary artery with other forms of angina pectoris: Secondary | ICD-10-CM

## 2022-03-21 DIAGNOSIS — E7800 Pure hypercholesterolemia, unspecified: Secondary | ICD-10-CM

## 2022-03-21 NOTE — Progress Notes (Signed)
? ?Primary Physician/Referring:  [name] ? ?Patient ID: Noah Campbell, male    DOB: 1941-03-09, 81 y.o.   MRN: [id number] ? ?Chief Complaint  ?Patient presents with  ?? Coronary Artery Disease  ?? Shortness of Breath  ?? Follow-up  ?  1 year  ? ?HPI:   ? ?Noah Campbell  is a 81 y.o. Caucasian male patient with coronary artery disease with stable angina pectoris, history of coronary angioplasty proximal LAD and distal LAD and mid RCA on 07/11/2016.  Past medical history significant for hyperlipidemia. ? ?This is annual visit, he lives in [place] during winter and travels back to [place] during the summer, he is accompanied by his wife, remains asymptomatic in fact did 3 hours of yard work yesterday and trimmed branches without any chest pain or dyspnea. ?He has been diagnosed with prostate cancer and is being evaluated for the same. ? ?Past Medical History:  ?Diagnosis Date  ?? Arthritis   ? "knees, hands, fingers" (07/11/2016)  ?? Coronary artery disease   ?? High cholesterol dx'd 06/2016  ?? Memory disorder 05/06/2015  ? "lost 2 days while in hospital in 2013;checked myself in; no memory til 2nd day;knew why i was there on that 2nd day;ran every test;never figured out what happened;had very small episodes afterwards;saw neurologist in [place] said it was old age;continue to have these once in awhile; memory recovers after 5-10 minutes;have some long term memory issues too" (07/11/2016)  ?? 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 ?Past Surgical History:  ?Procedure Laterality Date  ?? CARDIAC CATHETERIZATION N/A 07/11/2016  ? Procedure: Left Heart Cath and Coronary Angiography;  Surgeon: Adrian Prows, MD;  Location: [facility];  Service: Cardiovascular;  Laterality: N/A;  ?? CORONARY ANGIOPLASTY WITH STENT PLACEMENT  07/11/2016  ? "3 stents"  ?? LAPAROSCOPIC CHOLECYSTECTOMY  2014  ? ?Family History  ?Problem Relation Age of  Onset  ?? Colon cancer Mother   ?? Prostate cancer Father   ?? Heart attack Father   ?? Pancreatic cancer Brother   ?  ?Social History  ? ?Tobacco Use  ?? Smoking status: Former  ?  Packs/day: 1.00  ?  Years: 17.00  ?  Pack years: 17.00  ?  Types: Cigarettes  ?  Quit date: 11/14/1971  ?  Years since quitting: 50.3  ?? Smokeless tobacco: Never  ?Substance Use Topics  ?? Alcohol use: No  ? ?Marital Status: Married  ?ROS  ?Review of Systems  ?Cardiovascular:  Negative for chest pain, dyspnea on exertion and leg swelling.  ?Objective  ?Blood pressure 133/78, pulse 79, temperature 98.9 ?F (37.2 ?C), temperature source Temporal, resp. rate 16, height '5\' 11"'$  (1.803 m), weight 234 lb 3.2 oz (106.2 kg), SpO2 95 %. Body mass index is 32.66 kg/m?.  ? ?  03/21/2022  ?  2:49 PM 08/10/2021  ?  9:57 AM 07/18/2021  ?  1:22 PM  ?Vitals with BMI  ?Height '5\' 11"'$  '5\' 11"'$    ?Weight 234 lbs 3 oz 232 lbs 6 oz   ?BMI 32.68 32.43   ?Systolic 786 767 209  ?Diastolic 78 68 66  ?Pulse 79 71 48  ?  ? Physical Exam ?Constitutional:   ?   Appearance: He is obese.  ?Neck:  ?   Vascular: No carotid bruit or JVD.  ?Cardiovascular:  ?   Rate and Rhythm: Normal rate and regular rhythm.  ?  Pulses: Intact distal pulses.  ?   Heart sounds: Normal heart sounds. No murmur heard. ?  No gallop.  ?Pulmonary:  ?   Effort: Pulmonary effort is normal.  ?   Breath sounds: Normal breath sounds.  ?Abdominal:  ?   General: Bowel sounds are normal.  ?   Palpations: Abdomen is soft.  ?   Hernia: A hernia is present. Hernia is present in the ventral area.  ?Musculoskeletal:     ?   General: No swelling.  ?   Right lower leg: No edema.  ?   Left lower leg: No edema.  ?  ? ?Laboratory examination:  ? ?Recent Labs  ?  07/18/21 ?1237  ?NA 137  ?K 3.7  ?CL 105  ?CO2 23  ?GLUCOSE 112*  ?BUN 25*  ?CREATININE 0.97  ?CALCIUM 8.9  ?GFRNONAA >60  ? ?CrCl cannot be calculated (Patient's most recent lab result is older than the maximum 21 days allowed.).  ? ?  Latest Ref Rng & Units  07/18/2021  ? 12:37 PM 07/12/2016  ?  5:23 AM 06/28/2009  ? 11:31 AM  ?CMP  ?Glucose 70 - 99 mg/dL 112   98   100    ?BUN 8 - 23 mg/dL '25   13   11    '$ ?Creatinine 0.61 - 1.24 mg/dL 0.97   1.05   0.89    ?Sodium 135 - 145 mmol/L 137   137   137    ?Potassium 3.5 - 5.1 mmol/L 3.7   4.0   4.2    ?Chloride 98 - 111 mmol/L 105   108   105    ?CO2 22 - 32 mmol/L '23   23   26    '$ ?Calcium 8.9 - 10.3 mg/dL 8.9   8.7   9.4    ?Total Protein 6.5 - 8.1 g/dL 7.1    6.9    ?Total Bilirubin 0.3 - 1.2 mg/dL 0.7    1.0    ?Alkaline Phos 38 - 126 U/L 84    65    ?AST 15 - 41 U/L 17    29    ?ALT 0 - 44 U/L 23    15    ? ? ?  Latest Ref Rng & Units 07/18/2021  ? 12:37 PM 07/12/2016  ?  5:23 AM 06/28/2009  ? 11:31 AM  ?CBC  ?WBC 4.0 - 10.5 K/uL 14.3   8.8   5.9    ?Hemoglobin 13.0 - 17.0 g/dL 13.5   13.5   14.1    ?Hematocrit 39.0 - 52.0 % 39.3   40.3   40.1    ?Platelets 150 - 400 K/uL 260   138   189    ? ? ?Lipid Panel ?No results for input(s): CHOL, TRIG, LDLCALC, VLDL, HDL, CHOLHDL, LDLDIRECT in the last 8760 hours. ?Lipid Panel  ?No results found for: CHOL, TRIG, HDL, CHOLHDL, VLDL, LDLCALC, LDLDIRECT, LABVLDL  ? ?HEMOGLOBIN A1C ?No results found for: HGBA1C, MPG ?TSH ?No results for input(s): TSH in the last 8760 hours. ? ?External labs:  ? ? ?Medications and allergies  ? ?Allergies  ?Allergen Reactions  ?? Tylenol [Acetaminophen] Other (See Comments)  ?  dizziness  ?? Aleve [Naproxen Sodium] Other (See Comments)  ?  Dizziness ?  ?  ?Medication list after today's encounter  ? ? ?Current Outpatient Medications:  ??  aspirin 81 MG tablet, Take 81 mg by mouth daily., Disp: , Rfl:  ??  atorvastatin (  LIPITOR) 40 MG tablet, TAKE 1 TABLET BY MOUTH  DAILY, Disp: 90 tablet, Rfl: 0 ??  Boswellia-Glucosamine-Vit D (GLUCOSAMINE COMPLEX PO), Take 2 capsules by mouth daily. , Disp: , Rfl:  ??  Cholecalciferol (VITAMIN D3) 25 MCG (1000 UT) CAPS, Take 1 capsule by mouth daily., Disp: , Rfl:  ??  famotidine (PEPCID) 40 MG tablet, Take 1 tablet (40 mg  total) by mouth daily., Disp: 90 tablet, Rfl: 2 ??  metoprolol succinate (TOPROL-XL) 50 MG 24 hr tablet, TAKE 1 TABLET BY MOUTH  DAILY, Disp: 90 tablet, Rfl: 3 ??  vitamin B-12 (CYANOCOBALAMIN) 1000 MCG tablet, Take 1,000 mcg by mouth daily., Disp: , Rfl:   ?Radiology:  ? ?No results found. ? ?Cardiac Studies:  ? ?Coronary angiogram 07/11/2016:  ?Normal LVEF. Prox LAD 95% S/P 3.5x24 Synergy DES. Distal LAD 95% S/P 2.5x20 Syncergy DES. Prox RCA 95% S/P 4.0x38 mm Promus DES. Prox Cx 60%. ? ?PCV ECHOCARDIOGRAM COMPLETE 09/15/2021  ?Normal LV systolic function with visual EF 55-60%. Left ventricle cavity is normal in size. Normal left ventricular wall thickness. Normal global wall motion. Normal diastolic filling pattern, normal LAP. ?Mild (Grade I) mitral regurgitation. ?Mild tricuspid regurgitation. No evidence of pulmonary hypertension. ?Mild pulmonic regurgitation. ?Compared to study 12/27/2016: Mild MR and PR are new otherwise no significant change. ?   ?PCV MYOCARDIAL PERFUSION WITH LEXISCAN 08/31/2021  ?Lexiscan/modified Bruce Tetrofosmin stress test 08/31/2021: ?Lexiscan/modified Bruce nuclear stress test performed using 1-day protocol. Patient walked for 2.2 METS and reached 80% MPHR. ?Normal myocardial perfusion. Stress LVEF is mildly dysfunctional 42%. While there is no definite ischemia, recommend clinical correlation given low stress LVEF and frequent PVC's. ?Compared to exercise nuclear stress test 81 2017, large inferior ischemic change no longer present.  Previously treadmill exercise stress test. ?  ? ?EKG:  ?EKG 03/21/2022: Normal sinus rhythm with rate of 76 bpm, left axis deviation, left anterior fascicular block.  Incomplete right bundle branch block.  No evidence of ischemia. No significant change from EKG 08/10/2021. ? ?Assessment  ? ?  ICD-10-CM   ?1. Coronary artery disease involving native coronary artery of native heart with other form of angina pectoris ([place])  I25.118 EKG 12-Lead  ?  ?2. Dyspnea  on exertion  R06.09   ?  ?3. Pure hypercholesterolemia  E78.00   ?  ?  ? ?There are no discontinued medications. ?  ?No orders of the defined types were placed in this encounter. ? ?Orders Placed This Encount

## 2022-03-30 DIAGNOSIS — I483 Typical atrial flutter: Secondary | ICD-10-CM | POA: Insufficient documentation

## 2022-04-03 ENCOUNTER — Encounter: Payer: Self-pay | Admitting: Cardiology

## 2022-04-05 NOTE — Progress Notes (Signed)
Labs 12/05/2021:  A1c 5.3%, TSH 2.640, normal.  Vitamin D 44.4.  Total cholesterol 138, triglycerides 103, HDL 38, LDL 81.  BUN 13, creatinine 1.10, EGFR 67 mL, potassium 4.4.  Hb 14.5/HCT 41.5, platelets 141.  Normal indicis.

## 2022-04-06 DIAGNOSIS — C61 Malignant neoplasm of prostate: Secondary | ICD-10-CM | POA: Insufficient documentation

## 2022-04-06 NOTE — Progress Notes (Signed)
GU Location of Tumor / Histology: Prostate Ca  If Prostate Cancer, Gleason Score is (4 + 3) and PSA is (8.44 as of 03/09/2022, 9.2 as of 01/2021)  Biopsies: Dr. Junious Silk     Past/Anticipated interventions by urology, if any:     Past/Anticipated interventions by medical oncology, if any:  NA  Weight changes, if any:  No  IPSS:  7 SHIM:  21  Bowel/Bladder complaints, if any:  Constipation due medication.  Urinary frequency and Nocturia.  Nausea/Vomiting, if any:  No  Pain issues, if any:  5/10 Bilateral knees when movement or standing for long periods of time.  SAFETY ISSUES: Prior radiation?   No Pacemaker/ICD?  No Possible current pregnancy? Male Is the patient on methotrexate? No  Current Complaints / other details:  Need more information on treatment options.

## 2022-04-07 ENCOUNTER — Ambulatory Visit
Admission: RE | Admit: 2022-04-07 | Discharge: 2022-04-07 | Disposition: A | Discharge status: Home / Self Care | Payer: Medicare Other | Source: Ambulatory Visit | Attending: Radiation Oncology | Admitting: Radiation Oncology

## 2022-04-07 VITALS — BP 127/76 | HR 65 | Temp 97.5°F | Resp 20 | Ht 71.0 in | Wt 234.4 lb

## 2022-04-07 DIAGNOSIS — Z87891 Personal history of nicotine dependence: Secondary | ICD-10-CM | POA: Insufficient documentation

## 2022-04-07 DIAGNOSIS — E7800 Pure hypercholesterolemia, unspecified: Secondary | ICD-10-CM | POA: Insufficient documentation

## 2022-04-07 DIAGNOSIS — Z79899 Other long term (current) drug therapy: Secondary | ICD-10-CM | POA: Insufficient documentation

## 2022-04-07 DIAGNOSIS — I2510 Atherosclerotic heart disease of native coronary artery without angina pectoris: Secondary | ICD-10-CM | POA: Diagnosis not present

## 2022-04-07 DIAGNOSIS — Z800 Family history of malignant neoplasm of digestive organs: Secondary | ICD-10-CM | POA: Insufficient documentation

## 2022-04-07 DIAGNOSIS — Z7982 Long term (current) use of aspirin: Secondary | ICD-10-CM | POA: Insufficient documentation

## 2022-04-07 DIAGNOSIS — C61 Malignant neoplasm of prostate: Secondary | ICD-10-CM | POA: Diagnosis present

## 2022-04-07 DIAGNOSIS — Z8042 Family history of malignant neoplasm of prostate: Secondary | ICD-10-CM | POA: Insufficient documentation

## 2022-04-07 NOTE — Progress Notes (Signed)
Introduced myself to the patient, patient's wife, and daughter as the prostate nurse navigator.  He is here to discuss his radiation treatment options.  I gave him my business card and asked him to call me with questions or concerns.  Verbalized understanding.

## 2022-04-07 NOTE — Progress Notes (Signed)
Radiation Oncology         [phone] ________________________________  Initial Outpatient Consultation  Name: Noah Campbell MRN: [id number]  Date: 04/07/2022  DOB: 07-11-1941  [id number], [facility]  [name], MD   REFERRING PHYSICIAN: [name], MD  DIAGNOSIS: 81 y.o. gentleman with Stage T1c adenocarcinoma of the prostate with Gleason score of 4+3, and PSA of 8.4.    ICD-10-CM   1. Malignant neoplasm of prostate ([place])  C61       HISTORY OF PRESENT ILLNESS: Noah Campbell is a 81 y.o. male with a diagnosis of prostate cancer. He was initially noted to have an elevated PSA of 7.83 back in 08/2015, but he did not follow up. He currently spends half the year in [place] and the other half here in [place]. More recently, he was noted to have an elevated PSA of 9.2 in 01/2021 by his primary care physician in [place]. When he was seen by a PCP here in [place], he was referred for evaluation in urology by Dr. Junious Silk on 07/04/21,  digital rectal examination was performed at that time revealing no nodules. He was scheduled for prostate biopsy at that time but unfortunately, he got COVID and had to cancel the procedure and then went back to [place].  On follow up with repeat PSA obtained in 03/09/2022 the PSA remained elevated at 8.44. The patient proceeded to transrectal ultrasound with 12 biopsies of the prostate on 03/15/22.  The prostate volume measured 58.64 cc.  Out of 12 core biopsies, 6 were positive.  The maximum Gleason score was 4+3, and this was seen in the left apex lateral and left apex. Additionally, Gleason 3+4 was seen in the left mid lateral, and Gleason 3+3 in the right base lateral (two small foci), right mid lateral (small focus), and right apex lateral. He has just completed antibiotics for post-procedure UTI and feels that his LUTS have now resolved.  The patient reviewed the biopsy results with his urologist and he has kindly been  referred today for discussion of potential radiation treatment options.   PREVIOUS RADIATION THERAPY: No  PAST MEDICAL HISTORY:  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   "lost 2 days while in hospital in 2013;checked myself in; no memory til 2nd day;knew why i was there on that 2nd day;ran every test;never figured out what happened;had very small episodes afterwards;saw neurologist in [place] said it was old age;continue to have these once in awhile; memory recovers after 5-10 minutes;have some long term memory issues too" (07/11/2016)   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   Varicose vein of leg    bilateral      PAST SURGICAL HISTORY: Past Surgical History:  Procedure Laterality Date   CARDIAC CATHETERIZATION N/A 07/11/2016   Procedure: Left Heart Cath and Coronary Angiography;  Surgeon: Adrian Prows, MD;  Location: [facility];  Service: Cardiovascular;  Laterality: N/A;   CORONARY ANGIOPLASTY WITH STENT PLACEMENT  07/11/2016   "3 stents"   LAPAROSCOPIC CHOLECYSTECTOMY  2014    FAMILY HISTORY:  Family History  Problem Relation Age of Onset   Colon cancer Mother    Prostate cancer Father    Heart attack Father    Pancreatic cancer Brother     SOCIAL HISTORY:  Social History   Socioeconomic History   Marital  status: Married    Spouse name: Not on file   Number of children: 3   Years of education: 28   Highest education level: Not on file  Occupational History   Occupation: retired  Tobacco Use   Smoking status: Former    Packs/day: 1.00    Years: 17.00    Pack years: 17.00    Types: Cigarettes    Quit date: 11/14/1971    Years since quitting: 50.4   Smokeless tobacco: Never  Vaping Use   Vaping Use: Never used  Substance and Sexual Activity   Alcohol use: No   Drug use: No   Sexual activity: Not Currently  Other  Topics Concern   Not on file  Social History Narrative   Patient does not drink caffeine.   Patient is right handed.   Social Determinants of Health   Financial Resource Strain: Not on file  Food Insecurity: Not on file  Transportation Needs: Not on file  Physical Activity: Not on file  Stress: Not on file  Social Connections: Not on file  Intimate Partner Violence: Not on file    ALLERGIES: Tylenol [acetaminophen] and Aleve [naproxen sodium]  MEDICATIONS:  Current Outpatient Medications  Medication Sig Dispense Refill   aspirin 81 MG tablet Take 81 mg by mouth daily.     atorvastatin (LIPITOR) 40 MG tablet TAKE 1 TABLET BY MOUTH  DAILY 90 tablet 0   Boswellia-Glucosamine-Vit D (GLUCOSAMINE COMPLEX PO) Take 2 capsules by mouth daily.      Cholecalciferol (VITAMIN D3) 25 MCG (1000 UT) CAPS Take 1 capsule by mouth daily.     famotidine (PEPCID) 40 MG tablet Take 1 tablet (40 mg total) by mouth daily. 90 tablet 2   metoprolol succinate (TOPROL-XL) 50 MG 24 hr tablet TAKE 1 TABLET BY MOUTH  DAILY 90 tablet 3   vitamin B-12 (CYANOCOBALAMIN) 1000 MCG tablet Take 1,000 mcg by mouth daily.     No current facility-administered medications for this encounter.    REVIEW OF SYSTEMS:  On review of systems, the patient reports that he is doing well overall. He denies any chest pain, shortness of breath, cough, fevers, chills, night sweats, unintended weight changes. He denies any bowel disturbances, and denies abdominal pain, nausea or vomiting. He denies any new musculoskeletal or joint aches or pains. His IPSS was 7, indicating mild urinary symptoms. His SHIM was 21, indicating he does not have erectile dysfunction although he reports that his libido is decreased significantly over the past year. A complete review of systems is obtained and is otherwise negative.    PHYSICAL EXAM:  Wt Readings from Last 3 Encounters:  03/21/22 234 lb 3.2 oz (106.2 kg)  08/10/21 232 lb 6.4 oz (105.4 kg)   07/18/21 235 lb (106.6 kg)   Temp Readings from Last 3 Encounters:  03/21/22 98.9 F (37.2 C) (Temporal)  08/10/21 98.2 F (36.8 C) (Temporal)  07/12/16 98.1 F (36.7 C) (Oral)   BP Readings from Last 3 Encounters:  03/21/22 133/78  08/10/21 124/68  07/18/21 115/66   Pulse Readings from Last 3 Encounters:  03/21/22 79  08/10/21 71  07/18/21 (!) 48    /10  In general this is a well appearing Caucasian male in no acute distress. He's alert and oriented x4 and appropriate throughout the examination. Cardiopulmonary assessment is negative for acute distress, and he exhibits normal effort.     KPS = 100  100 - Normal; no complaints; no evidence of disease. 90   -  Able to carry on normal activity; minor signs or symptoms of disease. 80   - Normal activity with effort; some signs or symptoms of disease. 57   - Cares for self; unable to carry on normal activity or to do active work. 60   - Requires occasional assistance, but is able to care for most of his personal needs. 50   - Requires considerable assistance and frequent medical care. 58   - Disabled; requires special care and assistance. 31   - Severely disabled; hospital admission is indicated although death not imminent. 44   - Very sick; hospital admission necessary; active supportive treatment necessary. 10   - Moribund; fatal processes progressing rapidly. 0     - Dead  Karnofsky DA, Abelmann [place]alette, Craver LS and Burchenal [facility] [phone]) The use of the nitrogen mustards in the palliative treatment of carcinoma: with particular reference to bronchogenic carcinoma Cancer 1 634-56  LABORATORY DATA:  Lab Results  Component Value Date   WBC 14.3 (H) 07/18/2021   HGB 13.5 07/18/2021   HCT 39.3 07/18/2021   MCV 94.0 07/18/2021   PLT 260 07/18/2021   Lab Results  Component Value Date   NA 137 07/18/2021   K 3.7 07/18/2021   CL 105 07/18/2021   CO2 23 07/18/2021   Lab Results  Component Value Date   ALT 23 07/18/2021    AST 17 07/18/2021   ALKPHOS 84 07/18/2021   BILITOT 0.7 07/18/2021     RADIOGRAPHY: No results found.    IMPRESSION/PLAN: 1. 81 y.o. gentleman with Stage T1c adenocarcinoma of the prostate with Gleason Score of 4+3, and PSA of 8.4. We discussed the patient's workup and outlined the nature of prostate cancer in this setting. The patient's T stage, Gleason's score, and PSA put him into the unfavorable intermediate risk group. Accordingly, he is eligible for a variety of potential treatment options including brachytherapy, 5.5 weeks of external radiation, or prostatectomy. We discussed the available radiation techniques, and focused on the details and logistics of delivery. We discussed and outlined the risks, benefits, short and long-term effects associated with radiotherapy and compared and contrasted these with prostatectomy. We discussed the role of SpaceOAR gel in reducing the rectal toxicity associated with radiotherapy. He appears to have a good understanding of his disease and our treatment recommendations which are of curative intent.  He was encouraged to ask questions that were answered to his stated satisfaction.  At the conclusion of our conversation, the patient is undecided but appears to be leaning towards moving forward with brachytherapy. He would like to take some additional time to consider his options and discuss the treatment options with his brother prior to committing to treatment.  He has our contact information to let us know once he has reached a final decision so that we can move forward with treatment planning accordingly.  We will share our discussion with Dr. Junious Silk to keep him in the loop as well.  We enjoyed meeting him and his family today and look forward to continuing to participate in his care.  We personally spent 70 minutes in this encounter including chart review, reviewing radiological studies, meeting face-to-face with the patient, entering orders and completing  documentation.    Nicholos Johns, PA-C    Tyler Pita, MD  [facility] Direct Dial: [phone]  Fax: [phone] [email or web address]  Skype  [email or web address]   This document serves as a record of services personally performed by Tyler Pita, MD and  Ashlyn Bruning, PA-C. It was created on their behalf by Wilburn Mylar, a trained medical scribe. The creation of this record is based on the scribe's personal observations and the provider's statements to them. This document has been checked and approved by the attending provider.

## 2022-04-11 NOTE — Progress Notes (Signed)
Patient was RadOnc consult on 5/26 for stage T1c adenocarcinoma of the prostate with Gleason Score of 4+3, and PSA of 8.4.    Patient has decided to proceed with brachytherapy as his treatment.    RN answered all questions and patient aware of next steps.  Will continue to follow to ensure navigation needs are met.

## 2022-04-13 ENCOUNTER — Telehealth: Payer: Self-pay | Admitting: *Deleted

## 2022-04-13 NOTE — Telephone Encounter (Signed)
CALLED PATIENT TO ASK QUESTIONS, LVM FOR A RETURN CALL 

## 2022-04-25 ENCOUNTER — Telehealth: Payer: Self-pay | Admitting: *Deleted

## 2022-04-25 NOTE — Telephone Encounter (Signed)
RETURNED PATIENT'S PHONE CALL, LVM FOR A RETURN CALL 

## 2022-05-02 ENCOUNTER — Encounter: Payer: Self-pay | Admitting: Student

## 2022-05-03 ENCOUNTER — Telehealth: Payer: Self-pay | Admitting: *Deleted

## 2022-05-03 ENCOUNTER — Other Ambulatory Visit: Payer: Self-pay | Admitting: Urology

## 2022-05-03 NOTE — Telephone Encounter (Signed)
CALLED PATIENT TO INFORM OF IMPLANT DATE, SPOKE WITH PATIENT AND HE IS AWARE OF THIS DATE 

## 2022-05-04 NOTE — Progress Notes (Signed)
Patient chart reviewed, patient has current ekg in epic, no ekg needed for appointment 06-01-2022.

## 2022-05-29 ENCOUNTER — Telehealth: Payer: Self-pay | Admitting: *Deleted

## 2022-05-29 NOTE — Telephone Encounter (Signed)
RETURNED PATIENT'S PHONE CALL, SPOKE WITH PATIENT. ?

## 2022-05-31 ENCOUNTER — Telehealth: Payer: Self-pay | Admitting: *Deleted

## 2022-05-31 NOTE — Telephone Encounter (Signed)
CALLED PATIENT TO REMIND OF PRE-SEED APPTS FOR 06-01-22, AND HIS IMPLANT TIME OF BEING CHANGED TO 7:30 AM @ [facility] MAIN OR, SPOKE WITH PATIENT AND HE IS AWARE OF THESE APPTS.

## 2022-05-31 NOTE — Progress Notes (Signed)
  Radiation Oncology         [phone] ________________________________  Name: Noah Campbell MRN: [id number]  Date: 06/01/2022  DOB: 01-May-1941  SIMULATION AND TREATMENT PLANNING NOTE PUBIC ARCH STUDY  SN:[id number], [facility]  [name], MD  DIAGNOSIS:  81 y.o. gentleman with Stage T1c adenocarcinoma of the prostate with Gleason score of 4+3, and PSA of 8.4.  Oncology History  Malignant neoplasm of prostate ([place])  03/15/2022 Cancer Staging   Staging form: Prostate, AJCC 8th Edition - Clinical stage from 03/15/2022: Stage IIC (cT1c, cN0, cM0, PSA: 8.4, Grade Group: 3) - Signed by Freeman Caldron, PA-C on 04/07/2022 Histopathologic type: Adenocarcinoma, NOS Stage prefix: Initial diagnosis Prostate specific antigen (PSA) range: Less than 10 Gleason primary pattern: 4 Gleason secondary pattern: 3 Gleason score: 7 Histologic grading system: 5 grade system Number of biopsy cores examined: 12 Number of biopsy cores positive: 6 Location of positive needle core biopsies: Both sides   04/06/2022 Initial Diagnosis   Malignant neoplasm of prostate ([place])       ICD-10-CM   1. Malignant neoplasm of prostate ([place])  C61       COMPLEX SIMULATION:  The patient presented today for evaluation for possible prostate seed implant. He was brought to the radiation planning suite and placed supine on the CT couch. A 3-dimensional image study set was obtained in upload to the planning computer. There, on each axial slice, I contoured the prostate gland. Then, using three-dimensional radiation planning tools I reconstructed the prostate in view of the structures from the transperineal needle pathway to assess for possible pubic arch interference. In doing so, I did not appreciate any pubic arch interference. Also, the patient's prostate volume was estimated based on the drawn structure. The volume was 61 cc.  Given the pubic arch appearance and prostate volume, patient remains  a good candidate to proceed with prostate seed implant. Today, he freely provided informed written consent to proceed.    PLAN: The patient will undergo prostate seed implant.   ________________________________  Sheral Apley. Tammi Klippel, M.D.

## 2022-06-01 ENCOUNTER — Ambulatory Visit
Admission: RE | Admit: 2022-06-01 | Discharge: 2022-06-01 | Disposition: A | Discharge status: Home / Self Care | Payer: Medicare Other | Source: Ambulatory Visit | Attending: Radiation Oncology | Admitting: Radiation Oncology

## 2022-06-01 ENCOUNTER — Other Ambulatory Visit: Payer: Self-pay

## 2022-06-01 ENCOUNTER — Encounter: Payer: Self-pay | Admitting: Urology

## 2022-06-01 ENCOUNTER — Ambulatory Visit
Admission: RE | Admit: 2022-06-01 | Discharge: 2022-06-01 | Disposition: A | Discharge status: Home / Self Care | Payer: Medicare Other | Source: Ambulatory Visit | Attending: Urology | Admitting: Urology

## 2022-06-01 VITALS — Resp 19 | Ht 71.0 in | Wt 232.0 lb

## 2022-06-01 DIAGNOSIS — C61 Malignant neoplasm of prostate: Secondary | ICD-10-CM | POA: Diagnosis present

## 2022-06-01 NOTE — Progress Notes (Signed)
Pre-Seed appointment. I verified patient's identity and began nursing interview w/ spouse Mrs. Monette Rother in attendance. Patient reports urinary urgency and polyuria. Mr. Fesler has stopped taking his Flomax on his own and states he "Thinks the Flomax is not working." No other issues reported at this time.  Meaningful use complete. I-PSS score of 19-moderate. Flomax-STOPPED Urology appt- Sep, 2023  Resp 19   Ht '5\' 11"'$  (1.803 m)   SpO2 100%   BMI 32.69 kg/m

## 2022-07-12 ENCOUNTER — Other Ambulatory Visit: Payer: Self-pay | Admitting: Urology

## 2022-07-12 NOTE — Patient Instructions (Addendum)
DUE TO SPACE LIMITATIONS, ONLY TWO VISITORS  (aged 83 and older) ARE ALLOWED TO COME WITH YOU AND STAY IN THE WAITING ROOM DURING YOUR PRE OP AND PROCEDURE.   **NO VISITORS ARE ALLOWED IN THE SHORT STAY AREA OR RECOVERY ROOM!!**  You are not required to quarantine at this time prior to your surgery. However, you must do this: Hand Hygiene often Do NOT share personal items Notify your provider if you are in close contact with someone who has COVID or you develop fever 100.4 or greater, new onset of sneezing, cough, sore throat, shortness of breath or body aches.       Your procedure is scheduled on:  Tuesday July 25, 2022  Report to [facility] Main [facility]tr[facility].  Report to admitting at:  05:15   AM  +++++Call this number if you have any questions or problems the morning of surgery [phone]  Do not eat food :After Midnight the night prior to your surgery/procedure.  After Midnight you may have the following liquids until    04:30  AM  DAY OF SURGERY  Clear Liquid Diet Water Black Coffee (sugar ok, NO MILK/CREAM OR CREAMERS)  Tea (sugar ok, NO MILK/CREAM OR CREAMERS) regular and decaf                             Plain Jell-O (NO RED)                                           Fruit ices (not with fruit pulp, NO RED)                                     Popsicles (NO RED)                                                                  Juice: apple, WHITE grape, WHITE cranberry Sports drinks like Gatorade (NO RED)               FOLLOW BOWEL PREP AND ANY ADDITIONAL PRE OP INSTRUCTIONS YOU RECEIVED FROM YOUR SURGEON'S OFFICE!!!    Use one (1)  Fleet enema the morning of your surgery    Oral Hygiene is also important to reduce your risk of infection.        Remember - BRUSH YOUR TEETH THE MORNING OF SURGERY WITH YOUR REGULAR TOOTHPASTE  Take ONLY these medicines the morning of surgery with A SIP OF WATER: Metoprolol, omeprazole   You may not have any metal on your  body including jewelry, and body piercing  Do not wear lotions, powders, cologne, or deodorant  Men may shave face and neck.  Contacts, Hearing Aids, dentures or bridgework may not be worn into surgery.   DO NOT [place]. PHARMACY WILL DISPENSE MEDICATIONS LISTED ON YOUR MEDICATION LIST TO YOU DURING YOUR ADMISSION [place]!   Patients discharged on the day of surgery will not be allowed to drive home.  Someone NEEDS to stay with you  for the first 24 hours after anesthesia.  Please read over the following fact sheets you were given: IF YOU HAVE QUESTIONS ABOUT YOUR PRE-OP INSTRUCTIONS, PLEASE CALL [phone]  ([place])   [place] - Preparing for Surgery Before surgery, you can play an important role.  Because skin is not sterile, your skin needs to be as free of germs as possible.  You can reduce the number of germs on your skin by washing with CHG (chlorahexidine gluconate) soap before surgery.  CHG is an antiseptic cleaner which kills germs and bonds with the skin to continue killing germs even after washing. Please DO NOT use if you have an allergy to CHG or antibacterial soaps.  If your skin becomes reddened/irritated stop using the CHG and inform your nurse when you arrive at Short Stay. Do not shave (including legs and underarms) for at least 48 hours prior to the first CHG shower.  You may shave your face/neck.  Please follow these instructions carefully:  1.  Shower with CHG Soap the night before surgery and the  morning of surgery.  2.  If you choose to wash your hair, wash your hair first as usual with your normal  shampoo.  3.  After you shampoo, rinse your hair and body thoroughly to remove the shampoo.                             4.  Use CHG as you would any other liquid soap.  You can apply chg directly to the skin and wash.  Gently with a scrungie or clean washcloth.  5.  Apply the CHG Soap to your body ONLY FROM THE NECK DOWN.   Do  not use on face/ open                           Wound or open sores. Avoid contact with eyes, ears mouth and genitals (private parts).                       Wash face,  Genitals (private parts) with your normal soap.             6.  Wash thoroughly, paying special attention to the area where your  surgery  will be performed.  7.  Thoroughly rinse your body with warm water from the neck down.  8.  DO NOT shower/wash with your normal soap after using and rinsing off the CHG Soap.            9.  Pat yourself dry with a clean towel.            10.  Wear clean pajamas.            11.  Place clean sheets on your bed the night of your first shower and do not  sleep with pets.  ON THE DAY OF SURGERY : Do not apply any lotions/deodorants the morning of surgery.  Please wear clean clothes to the hospital/surgery center.    FAILURE TO FOLLOW THESE INSTRUCTIONS MAY RESULT IN THE CANCELLATION OF YOUR SURGERY  PATIENT SIGNATURE_________________________________  NURSE SIGNATURE__________________________________  ________________________________________________________________________

## 2022-07-12 NOTE — Progress Notes (Addendum)
COVID Vaccine received:  '[]'$  No '[x]'$  Yes x 2 Date of any COVID positive Test in last 90 days: none  PCP - [facility], MD [address] D[address], [address]   [place], [place] [address]   [phone] (Work)   [phone] (Fax)      Cardiologist - Dr. Adrian Prows  Chest x-ray - 2011 EKG -  03-21-2022  Epic Stress Test -08-31-2021  Epic ECHO - 09-14-2021  Epic Cardiac Cath - [facility] cath w/ DES x 3   07-11-16  Dr. Einar Gip  Pacemaker/ICD device     '[x]'$  N/A Spinal Cord Stimulator:'[x]'$  No '[]'$  Yes      Other Implants:   Bowel Prep - Fleet Enema   Morning of surgery, Patient and Daughter are aware.   History of Sleep Apnea? '[x]'$  No '[]'$  Yes   Sleep Study Date:   CPAP used?- '[x]'$  No '[]'$  Yes  (Instruct to bring their mask & Tubing)  Does the patient monitor blood sugar? '[]'$  No '[]'$  Yes  '[x]'$  N/A  Blood Thinner Instructions:  Aspirin Instructions:ASA '81mg'$    hold 5 days prior to surgery per Dr Einar Gip Last Dose:  ERAS Protocol Ordered: '[x]'$  No  '[]'$  Yes PRE-SURGERY '[]'$  ENSURE  '[]'$  G2   Comments: has memory disorder ( daughter- Joseph Art has POA- she needs to be with patient in preop if possible. Patient's wife has dementia)   Activity level: Patient Can not climb a flight of stairs without difficulty; '[x]'$  No CP  but would have _SHOB and knee pain_____   Anesthesia review: CAD- DES x 3 in 2017, angina, HTN  Patient denies shortness of breath, fever, cough and chest pain at PAT appointment.  Patient verbalized understanding and agreement to the Pre-Surgical Instructions that were given to them at this PAT appointment. Patient was also educated of the need to review these PAT instructions again prior to his/her surgery.I reviewed the appropriate phone numbers to call if they have any and questions or concerns.

## 2022-07-14 ENCOUNTER — Encounter (HOSPITAL_COMMUNITY)
Admission: RE | Admit: 2022-07-14 | Discharge: 2022-07-14 | Disposition: A | Discharge status: Home / Self Care | Payer: Medicare Other | Source: Ambulatory Visit | Attending: Urology | Admitting: Urology

## 2022-07-14 ENCOUNTER — Encounter (HOSPITAL_COMMUNITY): Payer: Self-pay

## 2022-07-14 ENCOUNTER — Other Ambulatory Visit: Payer: Self-pay

## 2022-07-14 VITALS — BP 110/61 | HR 56 | Temp 97.9°F | Resp 16 | Ht 71.0 in

## 2022-07-14 DIAGNOSIS — I10 Essential (primary) hypertension: Secondary | ICD-10-CM | POA: Insufficient documentation

## 2022-07-14 DIAGNOSIS — Z01812 Encounter for preprocedural laboratory examination: Secondary | ICD-10-CM | POA: Diagnosis present

## 2022-07-14 HISTORY — DX: Personal history of urinary calculi: Z87.442

## 2022-07-14 HISTORY — DX: Essential (primary) hypertension: I10

## 2022-07-14 LAB — BASIC METABOLIC PANEL
Anion gap: 5 (ref 5–15)
BUN: 19 mg/dL (ref 8–23)
CO2: 23 mmol/L (ref 22–32)
Calcium: 8.9 mg/dL (ref 8.9–10.3)
Chloride: 113 mmol/L — ABNORMAL HIGH (ref 98–111)
Creatinine, Ser: 1.21 mg/dL (ref 0.61–1.24)
GFR, Estimated: 59 mL/min — ABNORMAL LOW (ref >60)
Glucose, Bld: 103 mg/dL — ABNORMAL HIGH (ref 70–99)
Potassium: 4.4 mmol/L (ref 3.5–5.1)
Sodium: 141 mmol/L (ref 135–145)

## 2022-07-14 LAB — CBC
HCT: 38.9 % — ABNORMAL LOW (ref 39.0–52.0)
Hemoglobin: 13.2 g/dL (ref 13.0–17.0)
MCH: 32.8 pg (ref 26.0–34.0)
MCHC: 33.9 g/dL (ref 30.0–36.0)
MCV: 96.8 fL (ref 80.0–100.0)
Platelets: 161 10*3/uL (ref 150–400)
RBC: 4.02 MIL/uL — ABNORMAL LOW (ref 4.22–5.81)
RDW: 13.5 % (ref 11.5–15.5)
WBC: 4.8 10*3/uL (ref 4.0–10.5)
nRBC: 0 % (ref 0.0–0.2)

## 2022-07-23 NOTE — Progress Notes (Addendum)
  Radiation Oncology         [phone] ________________________________  Name: Noah Campbell MRN: [id number]  Date: 07/25/22  DOB: 10-26-1941       Prostate Seed Implant  [name], [facility]  No ref. provider found  DIAGNOSIS:  81 y.o. gentleman with Stage T1c adenocarcinoma of the prostate with Gleason score of 4+3, and PSA of 8.4.  Oncology History  Malignant neoplasm of prostate ([name])  03/15/2022 Cancer Staging   Staging form: Prostate, AJCC 8th Edition - Clinical stage from 03/15/2022: Stage IIC (cT1c, cN0, cM0, PSA: 8.4, Grade Group: 3) - Signed by Noah Caldron, PA-C on 04/07/2022 Histopathologic type: Adenocarcinoma, NOS Stage prefix: Initial diagnosis Prostate specific antigen (PSA) range: Less than 10 Gleason primary pattern: 4 Gleason secondary pattern: 3 Gleason score: 7 Histologic grading system: 5 grade system Number of biopsy cores examined: 12 Number of biopsy cores positive: 6 Location of positive needle core biopsies: Both sides   04/06/2022 Initial Diagnosis   Malignant neoplasm of prostate ([name])       ICD-10-CM   1. Prostate cancer [name])  [place] Discharge patient      PROCEDURE: Insertion of radioactive I-125 seeds into the prostate gland.  RADIATION DOSE: 145 Gy, definitive therapy.  TECHNIQUE: Noah Campbell was brought to the operating room with the urologist. He was placed in the dorsolithotomy position. He was catheterized and a rectal tube was inserted. The perineum was shaved, prepped and draped. The ultrasound probe was then introduced into the rectum to see the prostate gland.  TREATMENT DEVICE: A needle grid was attached to the ultrasound probe stand and anchor needles were placed.  3D PLANNING: The prostate was imaged in 3D using a sagittal sweep of the prostate probe. These images were transferred to the planning computer. There, the prostate, urethra and rectum were defined on each axial reconstructed image.  Then, the software created an optimized 3D plan and a few seed positions were adjusted. The quality of the plan was reviewed using [name] information for the target and the following two organs at risk:  Urethra and Rectum.  Then the accepted plan was printed and handed off to the radiation therapist.  Under my supervision, the custom loading of the seeds and spacers was carried out and loaded into sealed vicryl sleeves.  These pre-loaded needles were then placed into the needle holder.Marland Kitchen  PROSTATE VOLUME STUDY:  Using transrectal ultrasound the volume of the prostate was verified to be 54.6 cc.  SPECIAL TREATMENT PROCEDURE/SUPERVISION AND HANDLING: The pre-loaded needles were then delivered under sagittal guidance. A total of 18 needles were used to deposit 59 seeds in the prostate gland. The individual seed activity was 0.613 mCi.  SpaceOAR:  Yes  COMPLEX SIMULATION: At the end of the procedure, an anterior radiograph of the pelvis was obtained to document seed positioning and count. Cystoscopy was performed to check the urethra and bladder.  MICRODOSIMETRY: At the end of the procedure, the patient was emitting 0.14 mR/hr at 1 meter. Accordingly, he was considered safe for hospital discharge.  PLAN: The patient will return to the radiation oncology clinic for post implant CT dosimetry in three weeks.   ________________________________  Noah Campbell, M.D.

## 2022-07-24 ENCOUNTER — Telehealth: Payer: Self-pay | Admitting: *Deleted

## 2022-07-24 NOTE — Telephone Encounter (Signed)
CALLED PATIENT TO REMIND OF PROCEDURE FOR 07-25-22, SPOKE WITH PATIENT AND HE IS AWARE OF THIS PROCEDURE

## 2022-07-24 NOTE — Anesthesia Preprocedure Evaluation (Signed)
Anesthesia Evaluation  Patient identified by MRN, date of birth, ID band Patient awake    Reviewed: Allergy & Precautions, NPO status , Patient's Chart, lab work & pertinent test results  Airway Mallampati: III  TM Distance: >3 FB Neck ROM: Full    Dental  (+) Edentulous Upper, Edentulous Lower   Pulmonary former smoker,    Pulmonary exam normal        Cardiovascular hypertension, Pt. on home beta blockers + CAD and + Cardiac Stents  Normal cardiovascular exam     Neuro/Psych Memory disorder negative psych ROS   GI/Hepatic Neg liver ROS,   Endo/Other  negative endocrine ROS  Renal/GU negative Renal ROS     Musculoskeletal  (+) Arthritis ,   Abdominal   Peds  Hematology negative hematology ROS (+)   Anesthesia Other Findings PROSTATE CANCER  Reproductive/Obstetrics                            Anesthesia Physical Anesthesia Plan  ASA: 3  Anesthesia Plan: General   Post-op Pain Management:    Induction: Intravenous  PONV Risk Score and Plan: 2 and Ondansetron, Dexamethasone and Treatment may vary due to age or medical condition  Airway Management Planned: Oral ETT  Additional Equipment:   Intra-op Plan:   Post-operative Plan: Extubation in OR  Informed Consent: I have reviewed the patients History and Physical, chart, labs and discussed the procedure including the risks, benefits and alternatives for the proposed anesthesia with the patient or authorized representative who has indicated his/her understanding and acceptance.     Dental advisory given  Plan Discussed with: CRNA  Anesthesia Plan Comments:        Anesthesia Quick Evaluation

## 2022-07-25 ENCOUNTER — Ambulatory Visit (HOSPITAL_COMMUNITY): Payer: Medicare Other | Admitting: Physician Assistant

## 2022-07-25 ENCOUNTER — Ambulatory Visit (HOSPITAL_COMMUNITY): Payer: Medicare Other

## 2022-07-25 ENCOUNTER — Ambulatory Visit (HOSPITAL_COMMUNITY)
Admission: RE | Admit: 2022-07-25 | Discharge: 2022-07-25 | Disposition: A | Discharge status: Home / Self Care | Payer: Medicare Other | Source: Ambulatory Visit | Attending: Urology | Admitting: Urology

## 2022-07-25 ENCOUNTER — Encounter (HOSPITAL_COMMUNITY): Payer: Self-pay | Admitting: Urology

## 2022-07-25 ENCOUNTER — Encounter (HOSPITAL_COMMUNITY)
Admission: RE | Disposition: A | Discharge status: Home / Self Care | Payer: Self-pay | Source: Ambulatory Visit | Attending: Urology

## 2022-07-25 ENCOUNTER — Ambulatory Visit (HOSPITAL_BASED_OUTPATIENT_CLINIC_OR_DEPARTMENT_OTHER): Payer: Medicare Other | Admitting: Anesthesiology

## 2022-07-25 DIAGNOSIS — C61 Malignant neoplasm of prostate: Secondary | ICD-10-CM

## 2022-07-25 DIAGNOSIS — M1990 Unspecified osteoarthritis, unspecified site: Secondary | ICD-10-CM | POA: Insufficient documentation

## 2022-07-25 DIAGNOSIS — Z955 Presence of coronary angioplasty implant and graft: Secondary | ICD-10-CM | POA: Insufficient documentation

## 2022-07-25 DIAGNOSIS — I10 Essential (primary) hypertension: Secondary | ICD-10-CM

## 2022-07-25 DIAGNOSIS — Z87891 Personal history of nicotine dependence: Secondary | ICD-10-CM | POA: Insufficient documentation

## 2022-07-25 DIAGNOSIS — I2510 Atherosclerotic heart disease of native coronary artery without angina pectoris: Secondary | ICD-10-CM | POA: Diagnosis not present

## 2022-07-25 HISTORY — PX: RADIOACTIVE SEED IMPLANT: SHX5150

## 2022-07-25 HISTORY — PX: SPACE OAR INSTILLATION: SHX6769

## 2022-07-25 SURGERY — INSERTION, RADIATION SOURCE, PROSTATE
Anesthesia: General

## 2022-07-25 MED ORDER — DEXAMETHASONE SODIUM PHOSPHATE 10 MG/ML IJ SOLN
INTRAMUSCULAR | Status: AC
  Filled 2022-07-25: qty 1

## 2022-07-25 MED ORDER — GLYCOPYRROLATE PF 0.2 MG/ML IJ SOSY
PREFILLED_SYRINGE | INTRAMUSCULAR | Status: DC | PRN
  Administered 2022-07-25 (×2): .1 mg via INTRAVENOUS

## 2022-07-25 MED ORDER — PHENYLEPHRINE 80 MCG/ML (10ML) SYRINGE FOR IV PUSH (FOR BLOOD PRESSURE SUPPORT)
PREFILLED_SYRINGE | INTRAVENOUS | Status: AC
  Filled 2022-07-25: qty 10

## 2022-07-25 MED ORDER — ONDANSETRON HCL 4 MG/2ML IJ SOLN
INTRAMUSCULAR | Status: DC | PRN
  Administered 2022-07-25: 4 mg via INTRAVENOUS

## 2022-07-25 MED ORDER — SODIUM CHLORIDE (PF) 0.9 % IJ SOLN
INTRAMUSCULAR | Status: AC
  Filled 2022-07-25: qty 10

## 2022-07-25 MED ORDER — LIDOCAINE 2% (20 MG/ML) 5 ML SYRINGE
INTRAMUSCULAR | Status: DC | PRN
  Administered 2022-07-25: 60 mg via INTRAVENOUS

## 2022-07-25 MED ORDER — SODIUM CHLORIDE 0.9 % IR SOLN
Status: DC | PRN
  Administered 2022-07-25: 3000 mL via INTRAVESICAL

## 2022-07-25 MED ORDER — ONDANSETRON HCL 4 MG/2ML IJ SOLN: 4.0000 mg | Freq: Once | INTRAMUSCULAR | Status: DC | PRN

## 2022-07-25 MED ORDER — LIDOCAINE HCL (PF) 2 % IJ SOLN
INTRAMUSCULAR | Status: AC
  Filled 2022-07-25: qty 5

## 2022-07-25 MED ORDER — EPHEDRINE SULFATE-NACL 50-0.9 MG/10ML-% IV SOSY
PREFILLED_SYRINGE | INTRAVENOUS | Status: DC | PRN
  Administered 2022-07-25: 5 mg via INTRAVENOUS
  Administered 2022-07-25: 10 mg via INTRAVENOUS
  Administered 2022-07-25: 5 mg via INTRAVENOUS

## 2022-07-25 MED ORDER — FLEET ENEMA 7-19 GM/118ML RE ENEM
1.0000 | ENEMA | Freq: Once | RECTAL | Status: DC
  Filled 2022-07-25: qty 1

## 2022-07-25 MED ORDER — DEXAMETHASONE SODIUM PHOSPHATE 10 MG/ML IJ SOLN
INTRAMUSCULAR | Status: DC | PRN
  Administered 2022-07-25: 5 mg via INTRAVENOUS

## 2022-07-25 MED ORDER — CIPROFLOXACIN IN D5W 400 MG/200ML IV SOLN
400.0000 mg | INTRAVENOUS | Status: AC
  Administered 2022-07-25: 400 mg via INTRAVENOUS
  Filled 2022-07-25: qty 200

## 2022-07-25 MED ORDER — PROPOFOL 10 MG/ML IV BOLUS
INTRAVENOUS | Status: DC | PRN
  Administered 2022-07-25: 100 mg via INTRAVENOUS

## 2022-07-25 MED ORDER — PHENYLEPHRINE 80 MCG/ML (10ML) SYRINGE FOR IV PUSH (FOR BLOOD PRESSURE SUPPORT)
PREFILLED_SYRINGE | INTRAVENOUS | Status: DC | PRN
  Administered 2022-07-25 (×2): 80 ug via INTRAVENOUS

## 2022-07-25 MED ORDER — AMISULPRIDE (ANTIEMETIC) 5 MG/2ML IV SOLN: 10.0000 mg | Freq: Once | INTRAVENOUS | Status: DC | PRN

## 2022-07-25 MED ORDER — FENTANYL CITRATE (PF) 100 MCG/2ML IJ SOLN
INTRAMUSCULAR | Status: DC | PRN
  Administered 2022-07-25: 100 ug via INTRAVENOUS

## 2022-07-25 MED ORDER — CHLORHEXIDINE GLUCONATE 0.12 % MT SOLN
15.0000 mL | Freq: Once | OROMUCOSAL | Status: AC
  Administered 2022-07-25: 15 mL via OROMUCOSAL

## 2022-07-25 MED ORDER — CEPHALEXIN 500 MG PO CAPS: 500.0000 mg | ORAL_CAPSULE | Freq: Two times a day (BID) | ORAL | 0 refills | Status: DC

## 2022-07-25 MED ORDER — FENTANYL CITRATE PF 50 MCG/ML IJ SOSY: 25.0000 ug | PREFILLED_SYRINGE | INTRAMUSCULAR | Status: DC | PRN

## 2022-07-25 MED ORDER — ORAL CARE MOUTH RINSE: 15.0000 mL | Freq: Once | OROMUCOSAL | Status: AC

## 2022-07-25 MED ORDER — ROCURONIUM BROMIDE 10 MG/ML (PF) SYRINGE
PREFILLED_SYRINGE | INTRAVENOUS | Status: AC
  Filled 2022-07-25: qty 10

## 2022-07-25 MED ORDER — LACTATED RINGERS IV SOLN: INTRAVENOUS | Status: DC

## 2022-07-25 MED ORDER — FENTANYL CITRATE (PF) 100 MCG/2ML IJ SOLN
INTRAMUSCULAR | Status: AC
  Filled 2022-07-25: qty 2

## 2022-07-25 MED ORDER — ONDANSETRON HCL 4 MG/2ML IJ SOLN
INTRAMUSCULAR | Status: AC
  Filled 2022-07-25: qty 2

## 2022-07-25 MED ORDER — STERILE WATER FOR IRRIGATION IR SOLN
Status: DC | PRN
  Administered 2022-07-25: 500 mL

## 2022-07-25 MED ORDER — EPHEDRINE 5 MG/ML INJ
INTRAVENOUS | Status: AC
  Filled 2022-07-25: qty 5

## 2022-07-25 MED ORDER — SUGAMMADEX SODIUM 200 MG/2ML IV SOLN
INTRAVENOUS | Status: DC | PRN
  Administered 2022-07-25: 200 mg via INTRAVENOUS

## 2022-07-25 MED ORDER — PROPOFOL 10 MG/ML IV BOLUS
INTRAVENOUS | Status: AC
  Filled 2022-07-25: qty 20

## 2022-07-25 MED ORDER — ROCURONIUM BROMIDE 10 MG/ML (PF) SYRINGE
PREFILLED_SYRINGE | INTRAVENOUS | Status: DC | PRN
  Administered 2022-07-25: 10 mg via INTRAVENOUS
  Administered 2022-07-25: 40 mg via INTRAVENOUS
  Administered 2022-07-25: 10 mg via INTRAVENOUS

## 2022-07-25 SURGICAL SUPPLY — 33 items
BAG COUNTER SPONGE SURGICOUNT (BAG) IMPLANT
BAG URINE DRAIN 2000ML AR STRL (UROLOGICAL SUPPLIES) ×1 IMPLANT
CATH FOLEY 2WAY SLVR  5CC 16FR (CATHETERS) ×2
CATH FOLEY 2WAY SLVR 5CC 16FR (CATHETERS) ×2 IMPLANT
CATH ROBINSON RED A/P 20FR (CATHETERS) ×1 IMPLANT
COVER BACK TABLE 60X90IN (DRAPES) ×1 IMPLANT
COVER MAYO STAND STRL (DRAPES) ×1 IMPLANT
COVER SURGICAL LIGHT HANDLE (MISCELLANEOUS) ×1 IMPLANT
DRAPE U-SHAPE 47X51 STRL (DRAPES) ×1 IMPLANT
DRSG TEGADERM 4X4.75 (GAUZE/BANDAGES/DRESSINGS) ×2 IMPLANT
DRSG TEGADERM 8X12 (GAUZE/BANDAGES/DRESSINGS) ×2 IMPLANT
GLOVE BIO SURGEON STRL SZ7.5 (GLOVE) ×1 IMPLANT
GLOVE ECLIPSE 8.0 STRL XLNG CF (GLOVE) ×1 IMPLANT
GLOVE SURG LX STRL 7.5 STRW (GLOVE) ×2 IMPLANT
GOWN STRL REUS W/ TWL XL LVL3 (GOWN DISPOSABLE) ×1 IMPLANT
GOWN STRL REUS W/TWL XL LVL3 (GOWN DISPOSABLE) ×1
GRID BRACH TEMP 18GA 2.8X3X.75 (MISCELLANEOUS) IMPLANT
HOLDER FOLEY CATH W/STRAP (MISCELLANEOUS) ×1 IMPLANT
IMPL SPACEOAR VUE SYSTEM (Spacer) ×1 IMPLANT
IMPLANT SPACEOAR VUE SYSTEM (Spacer) ×1 IMPLANT
MARKER GOLD PRELOAD 1.2X3 (Urological Implant) IMPLANT
MARKER SKIN DUAL TIP RULER LAB (MISCELLANEOUS) ×1 IMPLANT
NDL BRACHY 18G 5PK (NEEDLE) IMPLANT
NDL PK MORGANSTERN STABILIZ (NEEDLE) IMPLANT
NEEDLE BRACHY 18G 5PK (NEEDLE) ×5 IMPLANT
NEEDLE PK MORGANSTERN STABILIZ (NEEDLE) ×1 IMPLANT
PACK CYSTO (CUSTOM PROCEDURE TRAY) ×1 IMPLANT
PENCIL SMOKE EVACUATOR (MISCELLANEOUS) IMPLANT
SEED GOLD PRELOAD 1.2X3 (Urological Implant) ×59 IMPLANT
SURGILUBE 2OZ TUBE FLIPTOP (MISCELLANEOUS) ×1 IMPLANT
SYR 10ML LL (SYRINGE) ×1 IMPLANT
TOWEL OR 17X26 10 PK STRL BLUE (TOWEL DISPOSABLE) ×1 IMPLANT
UNDERPAD 30X36 HEAVY ABSORB (UNDERPADS AND DIAPERS) ×2 IMPLANT

## 2022-07-25 NOTE — Anesthesia Procedure Notes (Signed)
Procedure Name: Intubation Date/Time: 07/25/2022 7:41 AM  Performed by: Maxwell Caul, CRNAPre-anesthesia Checklist: Patient identified, Emergency Drugs available, Suction available and Patient being monitored Patient Re-evaluated:Patient Re-evaluated prior to induction Oxygen Delivery Method: Circle system utilized Preoxygenation: Pre-oxygenation with 100% oxygen Induction Type: IV induction Ventilation: Mask ventilation without difficulty Laryngoscope Size: Mac and 4 Grade View: Grade I Tube type: Oral Tube size: 7.5 mm Number of attempts: 1 Airway Equipment and Method: Stylet Placement Confirmation: ETT inserted through vocal cords under direct vision, positive ETCO2 and breath sounds checked- equal and bilateral Secured at: 21 cm Tube secured with: Tape Dental Injury: Teeth and Oropharynx as per pre-operative assessment

## 2022-07-25 NOTE — H&P (Signed)
H&P  Chief Complaint: Prostate cancer  History of Present Illness: [name] is an 81 year old male diagnosed with intermediate risk prostate cancer in May 2023.  His PSA ran from 8.4-9.2.  Exam was normal with a 58 g prostate and grade group 3, grade group 2 and grade group 1 disease on 6 of 12 cores with 5 to 50% of the cores involved.  He has mild lower urinary tract symptoms and takes tamsulosin on and off for frequency.  He has been well without dysuria or gross hematuria.  No fever cough or congestion.  He presents today for brachytherapy and SpaceOAR gel instillation.  Past Medical History:  Diagnosis Date   Arthritis    "knees, hands, fingers" (07/11/2016)   Coronary artery disease    High cholesterol dx'd 06/2016   History of kidney stones    Hypertension    Memory disorder 05/06/2015   "lost 2 days while in hospital in 2013;checked myself in; no memory til 2nd day;knew why i was there on that 2nd day;ran every test;never figured out what happened;had very small episodes afterwards;saw neurologist in [place] said it was old age;continue to have these once in awhile; memory recovers after 5-10 minutes;have some long term memory issues too" (07/11/2016)   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   07/20/2022   atorvastatin (LIPITOR) 40 MG tablet TAKE 1 TABLET BY MOUTH  DAILY (Patient taking differently: Take 40 mg by mouth daily.) 90 tablet 0 07/24/2022    Boswellia-Glucosamine-Vit D (GLUCOSAMINE COMPLEX PO) Take 2 capsules by mouth daily.    07/24/2022   Cholecalciferol (VITAMIN D3) 50 MCG (2000 UT) TABS Take 2,000 Units by mouth daily.   07/24/2022   metoprolol succinate (TOPROL-XL) 50 MG 24 hr tablet TAKE 1 TABLET BY MOUTH  DAILY (Patient taking differently: Take 50 mg by mouth daily.) 90 tablet 3 07/25/2022 at 0400   omeprazole (PRILOSEC) 20 MG capsule Take 20 mg by mouth daily.   07/25/2022 at 0400   tamsulosin (FLOMAX) 0.4 MG CAPS capsule Take 0.4 mg by mouth daily after supper.   07/24/2022   vitamin B-12 (CYANOCOBALAMIN) 1000 MCG tablet Take 1,000 mcg by mouth daily.   07/24/2022   Allergies:  Allergies  Allergen Reactions   Tylenol [Acetaminophen] Other (See Comments)    dizziness   Aleve [Naproxen Sodium] Other (See Comments)    Dizziness     Family History  Problem Relation Age of Onset   Colon cancer Mother    Prostate cancer Father    Heart attack Father    Pancreatic cancer Brother    Social History:  reports that he quit smoking about 50 years ago. His smoking use included cigarettes. He has a 17.00 pack-year smoking history. He has never used smokeless tobacco. He reports that he does not drink alcohol and does not use drugs.  ROS: A complete review of systems was performed.  All systems are negative except  for pertinent findings as noted. Review of Systems  All other systems reviewed and are negative.    Physical Exam:  Vital signs in last 24 hours: Temp:  [97.9 F (36.6 C)] 97.9 F (36.6 C) (09/12 0550) Pulse Rate:  [58] 58 (09/12 0550) Resp:  [18] 18 (09/12 0550) BP: (127)/(74) 127/74 (09/12 0550) SpO2:  [99 %] 99 % (09/12 0550) Weight:  [97.5 kg] 97.5 kg (09/12 0535) General:  Alert and oriented, No acute distress HEENT: Normocephalic, atraumatic Cardiovascular: Regular rate and rhythm Lungs: Regular rate and effort Abdomen: Soft, nontender, nondistended, no abdominal masses Back: No CVA  tenderness Extremities: No edema Neurologic: Grossly intact  Laboratory Data:  No results found for this or any previous visit (from the past 24 hour(s)). No results found for this or any previous visit (from the past 240 hour(s)). Creatinine: No results for input(s): "CREATININE" in the last 168 hours.  Impression/Assessment:  Intermediate risk prostate cancer -   Plan:  I discussed with the patient, his daughter and wife the nature, potential benefits, risks and alternatives to permanent prostate brachytherapy seed implant, SpaceOAR gel instillation, cystoscopy, including side effects of the proposed treatment, the likelihood of the patient achieving the goals of the procedure, and any potential problems that might occur during the procedure or recuperation.  We discussed the risk of urethral and rectal injury among others. All questions answered. Patient elects to proceed.    Noah Campbell 07/25/2022, 7:30 AM

## 2022-07-25 NOTE — Anesthesia Postprocedure Evaluation (Signed)
Anesthesia Post Note  Patient: Noah Campbell  Procedure(s) Performed: RADIOACTIVE SEED IMPLANT/BRACHYTHERAPY IMPLANT SPACE OAR INSTILLATION     Patient location during evaluation: PACU Anesthesia Type: General Level of consciousness: awake Pain management: pain level controlled Vital Signs Assessment: post-procedure vital signs reviewed and stable Respiratory status: spontaneous breathing, nonlabored ventilation, respiratory function stable and patient connected to nasal cannula oxygen Cardiovascular status: blood pressure returned to baseline and stable Postop Assessment: no apparent nausea or vomiting Anesthetic complications: no   No notable events documented.  Last Vitals:  Vitals:   07/25/22 0945 07/25/22 1005  BP: 125/67 114/71  Pulse: (!) 51 (!) 52  Resp: 15 18  Temp: 36.5 C   SpO2: 100% 98%    Last Pain:  Vitals:   07/25/22 1005  TempSrc:   PainSc: 0-No pain                 Elynore Dolinski P Seylah Wernert

## 2022-07-25 NOTE — Op Note (Signed)
Preoperative diagnosis: Prostate cancer Postoperative diagnosis: Same   Procedure: Prostate brachytherapy seed implant, SpaceOar Gel instillation, Cystoscopy   Surgeon: Junious Silk   Radiation oncologist: Tammi Klippel   Anesthesia: Gen.   Indication for procedure: 81 year old with intermediate risk prostate cancer who elected to proceed with prostate brachytherapy.   Findings: On fluoroscopic imaging there was adequate coverage of the prostate. On cystoscopy the urethra appeared normal, the prostatic urethra appeared normal, the trigone and ureteral orifices appeared normal with clear efflux. The bladder mucosa appeared normal. There were no stones, foreign bodies or seeds visualized in the bladder or urethra.   Dose:145 Gy   Description of procedure: After consent was obtained patient brought to the operating room. After adequate anesthesia he is placed in lithotomy position and the transrectal ultrasound probe and perineal template positioned. Catheters and brachytherapy seeds were placed per Dr. Johny Shears plan. A total of 18 catheters and 59 active sources (I-125) were placed. The anchoring needles, template and ultrasound were removed.   The 18-gauge needle was then inserted approximately 1 to 2 cm anterior to the anal opening and directed under ultrasonic guidance into the perirectal fat between the anterior rectal wall and the prostate capsule down to the mid-gland. Midline needle position was confirmed in the sagittal and axial views to verify the tip was in the perirectal fat.  Small amounts of saline were injected to hydrodissect the space between the prostate and the anterior rectal wall.  Axial imaging was viewed to confirm the needle was in the correct location in the mid gland and centered.  Aspiration confirmed no intravascular access.  The saline syringe was carefully disconnected maintaining the desired needle position and the hydrogel was attached to the needle.  Under ultrasound guidance  in the sagittal view a smooth continuous injection was done over about 12 seconds delivering the hydrogel into the space between the prostate and rectal wall.  The needle was withdrawn.  Scout flouro imaging was obtained of the implant. The Foley was removed and another image obtained. The patient was prepped again and cystoscopy was performed which was noted to be normal. He was awakened taken to the recovery room in stable condition.   Complications: None   Blood loss: Minimal   Specimens: None   Drains: none   Disposition: Patient stable to PACU.

## 2022-07-25 NOTE — Transfer of Care (Signed)
Immediate Anesthesia Transfer of Care Note  Patient: Noah Campbell  Procedure(s) Performed: RADIOACTIVE SEED IMPLANT/BRACHYTHERAPY IMPLANT SPACE OAR INSTILLATION  Patient Location: PACU  Anesthesia Type:General  Level of Consciousness: awake, alert  and oriented  Airway & Oxygen Therapy: Patient Spontanous Breathing and Patient connected to face mask oxygen  Post-op Assessment: Report given to RN and Post -op Vital signs reviewed and stable  Post vital signs: Reviewed and stable  Last Vitals:  Vitals Value Taken Time  BP 100/59 07/25/22 0901  Temp    Pulse 57 07/25/22 0902  Resp 15 07/25/22 0902  SpO2 100 % 07/25/22 0902  Vitals shown include unvalidated device data.  Last Pain:  Vitals:   07/25/22 0613  TempSrc:   PainSc: 0-No pain         Complications: No notable events documented.

## 2022-07-26 ENCOUNTER — Encounter (HOSPITAL_COMMUNITY): Payer: Self-pay | Admitting: Urology

## 2022-08-11 ENCOUNTER — Telehealth: Payer: Self-pay | Admitting: *Deleted

## 2022-08-11 NOTE — Telephone Encounter (Signed)
CALLED PATIENT TO REMIND OF POST SEED APPTS. ON 08-15-22- ARRIVAL TIME- 9:45 AM @ [facility], SPOKE WITH PATIENT AND HE IS AWARE OF THESE APPTS.

## 2022-08-15 ENCOUNTER — Encounter: Payer: Self-pay | Admitting: Urology

## 2022-08-15 ENCOUNTER — Other Ambulatory Visit: Payer: Self-pay

## 2022-08-15 ENCOUNTER — Ambulatory Visit
Admission: RE | Admit: 2022-08-15 | Discharge: 2022-08-15 | Disposition: A | Discharge status: Home / Self Care | Payer: Medicare Other | Source: Ambulatory Visit | Attending: Urology | Admitting: Urology

## 2022-08-15 ENCOUNTER — Ambulatory Visit
Admission: RE | Admit: 2022-08-15 | Discharge: 2022-08-15 | Disposition: A | Discharge status: Home / Self Care | Payer: Medicare Other | Source: Ambulatory Visit | Attending: Radiation Oncology | Admitting: Radiation Oncology

## 2022-08-15 VITALS — BP 101/58 | HR 56 | Temp 97.7°F | Resp 18 | Ht 71.0 in | Wt 218.0 lb

## 2022-08-15 DIAGNOSIS — C61 Malignant neoplasm of prostate: Secondary | ICD-10-CM

## 2022-08-15 NOTE — Progress Notes (Signed)
Radiation Oncology         [phone] ________________________________  Name: Noah Campbell MRN: [id number]  Date: 08/15/2022  DOB: 1940/12/12  Post-Seed Follow-Up Visit Note  CC: [name], MD  Diagnosis:   81 y.o. gentleman with Stage T1c adenocarcinoma of the prostate with Gleason score of 4+3, and PSA of 8.4.    ICD-10-CM   1. Malignant neoplasm of prostate (HCC)  C61       Interval Since Last Radiation:  3 weeks 07/25/22:  Insertion of radioactive I-125 seeds into the prostate gland; 145 Gy, definitive therapy with placement of SpaceOAR gel.  Narrative:  The patient returns today for routine follow-up.  He is complaining of increased urinary frequency and urinary hesitation symptoms. He filled out a questionnaire regarding urinary function today providing and overall IPSS score of 21 characterizing his symptoms as severe with nocturia x2, hesitancy, intermittency, weak flow of stream, urgency and frequency. He specifically denies gross hematuria, dysuria or incontinence. Fortunately, his LUTS are gradually improving and he continues taking Flomax daily as prescribed.  His pre-implant score was 7. He denies any abdominal pain but has had some post-procedure constipation and is using Miralax daily to stay regular. His energy level has not changed significantly and overall, he is pleased with his progress to date.  ALLERGIES:  is allergic to tylenol [acetaminophen] and aleve [naproxen sodium].  Meds: Current Outpatient Medications  Medication Sig Dispense Refill   aspirin 81 MG tablet Take 81 mg by mouth daily.     atorvastatin (LIPITOR) 40 MG tablet TAKE 1 TABLET BY MOUTH  DAILY (Patient taking differently: Take 40 mg by mouth daily.) 90 tablet 0   Boswellia-Glucosamine-Vit D (GLUCOSAMINE COMPLEX PO) Take 2 capsules by mouth daily.      cephALEXin (KEFLEX) 500 MG capsule Take 1 capsule (500 mg total) by mouth 2 (two) times  daily. 6 capsule 0   Cholecalciferol (VITAMIN D3) 50 MCG (2000 UT) TABS Take 2,000 Units by mouth daily.     metoprolol succinate (TOPROL-XL) 50 MG 24 hr tablet TAKE 1 TABLET BY MOUTH  DAILY (Patient taking differently: Take 50 mg by mouth daily.) 90 tablet 3   omeprazole (PRILOSEC) 20 MG capsule Take 20 mg by mouth daily.     tamsulosin (FLOMAX) 0.4 MG CAPS capsule Take 0.4 mg by mouth daily after supper.     vitamin B-12 (CYANOCOBALAMIN) 1000 MCG tablet Take 1,000 mcg by mouth daily.     No current facility-administered medications for this visit.    Physical Findings: In general this is a well appearing Caucasian male in no acute distress. He's alert and oriented x4 and appropriate throughout the examination. Cardiopulmonary assessment is negative for acute distress and he exhibits normal effort.   Lab Findings: Lab Results  Component Value Date   WBC 4.8 07/14/2022   HGB 13.2 07/14/2022   HCT 38.9 (L) 07/14/2022   MCV 96.8 07/14/2022   PLT 161 07/14/2022    Radiographic Findings:  Patient underwent CT imaging in our clinic for post implant dosimetry. The CT will be reviewed by Dr. Tammi Campbell to confirm there is an adequate distribution of radioactive seeds throughout the prostate gland and ensure that there are no seeds in or near the rectum.  We suspect the final radiation plan and dosimetry will show appropriate coverage of the prostate gland. He understands that we will call and inform him of any unexpected findings on further review of his imaging  and dosimetry.  Impression/Plan: 81 y.o. gentleman with Stage T1c adenocarcinoma of the prostate with Gleason score of 4+3, and PSA of 8.4. The patient is recovering from the effects of radiation. His urinary symptoms should gradually improve over the next 4-6 months. We talked about this today. He is encouraged by his improvement already and is otherwise pleased with his outcome. We also talked about long-term follow-up for prostate cancer  following seed implant. He understands that ongoing PSA determinations and digital rectal exams will help perform surveillance to rule out disease recurrence. He saw Noah Crocker, NP on 08/08/22 and has a follow up appointment scheduled for labs on 10/04/22 prior to his visit with Dr. Junious Campbell the following week. He understands what to expect with his PSA measures. Patient was also educated today about some of the long-term effects from radiation including a small risk for rectal bleeding and possibly erectile dysfunction. We talked about some of the general management approaches to these potential complications. However, I did encourage the patient to contact our office or return at any point if he has questions or concerns related to his previous radiation and prostate cancer.    Noah Johns, PA-C

## 2022-08-15 NOTE — Progress Notes (Signed)
Post-seed prostate nursing interview. I verified patient's identity and began nursing interview. Patient reports much improved polyuria, and dysuria. No other issues reported at this time.  Meaningful use complete. I-PSS score of 21-severe. Flomax as directed. Urology appt- Nov 30th, 2032  BP (!) 101/58 (BP Location: Left Arm, Patient Position: Sitting, Cuff Size: Normal)   Pulse (!) 56   Temp 97.7 F (36.5 C) (Temporal)   Resp 18   Ht '5\' 11"'$  (1.803 m)   Wt 218 lb (98.9 kg)   SpO2 100%   BMI 30.40 kg/m

## 2022-08-15 NOTE — Progress Notes (Signed)
  Radiation Oncology         [phone]) [phone] ________________________________  Name: Noah Campbell MRN: [id number]  Date: 08/15/2022  DOB: 09/19/1941  COMPLEX SIMULATION NOTE  NARRATIVE:  The patient was brought to the [facility] today following prostate seed implantation approximately one month ago.  Identity was confirmed.  All relevant records and images related to the planned course of therapy were reviewed.  Then, the patient was set-up supine.  CT images were obtained.  The CT images were loaded into the planning software.  Then the prostate and rectum were contoured.  Treatment planning then occurred.  The implanted iodine 125 seeds were identified by the physics staff for projection of radiation distribution  I have requested : 3D Simulation  I have requested a DVH of the following structures: Prostate and rectum.    ________________________________  Sheral Apley Tammi Klippel, M.D.

## 2022-09-06 ENCOUNTER — Encounter: Payer: Self-pay | Admitting: Radiation Oncology

## 2022-09-06 DIAGNOSIS — C61 Malignant neoplasm of prostate: Secondary | ICD-10-CM | POA: Diagnosis not present

## 2022-09-06 NOTE — Progress Notes (Signed)
  Radiation Oncology         ([phone]) [phone] ________________________________  Name: [name] MRN: [id number]  Date: 09/06/2022  DOB: 04-Apr-1941  3D Planning Note   Prostate Brachytherapy Post-Implant Dosimetry  Diagnosis: 81 y.o. gentleman with Stage T1c adenocarcinoma of the prostate with Gleason score of 4+3, and PSA of 8.4.  Narrative: On a previous date, Noah Campbell returned following prostate seed implantation for post implant planning. He underwent CT scan complex simulation to delineate the three-dimensional structures of the pelvis and demonstrate the radiation distribution.  Since that time, the seed localization, and complex isodose planning with dose volume histograms have now been completed.  Results:   Prostate Coverage - The dose of radiation delivered to the 90% or more of the prostate gland (D90) was 113.67% of the prescription dose. This exceeds our goal of greater than 90%. Rectal Sparing - The volume of rectal tissue receiving the prescription dose or higher was 0.0 cc. This falls under our thresholds tolerance of 1.0 cc.  Impression: The prostate seed implant appears to show adequate target coverage and appropriate rectal sparing.  Plan:  The patient will continue to follow with urology for ongoing PSA determinations. I would anticipate a high likelihood for local tumor control with minimal risk for rectal morbidity.  ________________________________  Sheral Apley Tammi Klippel, M.D.

## 2022-09-11 ENCOUNTER — Encounter: Payer: Self-pay | Admitting: *Deleted

## 2022-09-25 ENCOUNTER — Inpatient Hospital Stay: Payer: Medicare Other | Attending: Adult Health | Admitting: *Deleted

## 2022-09-25 DIAGNOSIS — C61 Malignant neoplasm of prostate: Secondary | ICD-10-CM

## 2022-09-25 NOTE — Progress Notes (Signed)
2 Identifiers were used for verification purposes. No vitals were taken as this was a telephone visit. Pt denies pain nor fatigue. Pt states he is still having urinary frequency and hesitancy. Pt gets up to bathroom at nighttime every 2 hours. No burning with urination since Flomax was increased to 0.8 mg. Pt is getting more exercise by walking 3x a week 30 minutes interval. Pt is trying to lose weight and doing well with his eating plan. He eats poultry and fish mostly with vegetables. Pt had some bouts of constipation earlier after treatment but since has had no issues with bowels moving. Pt received his first colonoscopy at 81 years old . Pt will see PCP on 09/27/22.  PSA labs will be drawn on 10/04/22.Next visit with urologist will be 10/12/22. Vaccines reviewed and  updated. Pt does not take immunizations. SCP reviewed and completed.

## 2022-10-20 ENCOUNTER — Other Ambulatory Visit: Payer: Self-pay | Admitting: Cardiology

## 2023-03-28 ENCOUNTER — Encounter: Payer: Self-pay | Admitting: Cardiology

## 2023-03-28 ENCOUNTER — Ambulatory Visit: Payer: Medicare Other | Admitting: Cardiology

## 2023-03-28 VITALS — BP 110/64 | HR 55 | Resp 16 | Ht 71.0 in | Wt 190.0 lb

## 2023-03-28 DIAGNOSIS — I25118 Atherosclerotic heart disease of native coronary artery with other forms of angina pectoris: Secondary | ICD-10-CM

## 2023-03-28 DIAGNOSIS — E7800 Pure hypercholesterolemia, unspecified: Secondary | ICD-10-CM

## 2023-03-28 DIAGNOSIS — R0609 Other forms of dyspnea: Secondary | ICD-10-CM

## 2023-03-28 MED ORDER — METOPROLOL SUCCINATE ER 50 MG PO TB24: 25.0000 mg | ORAL_TABLET | Freq: Every day | ORAL | 2 refills | Status: DC

## 2023-03-28 MED ORDER — LOSARTAN POTASSIUM 25 MG PO TABS: 25.0000 mg | ORAL_TABLET | Freq: Every evening | ORAL | 2 refills | Status: DC

## 2023-03-28 NOTE — Progress Notes (Signed)
Primary Physician/Referring:  Mattie Marlin, DO  Patient ID: Noah Campbell, male    DOB: August 08, 1941, 82 y.o.   MRN: [id number]  Chief Complaint  Patient presents with   Coronary Artery Disease   Hypertension   Hyperlipidemia   Follow-up    1 year   HPI:    Noah Campbell  is a 82 y.o. Caucasian male patient with coronary artery disease with stable angina pectoris, history of coronary angioplasty proximal LAD and distal LAD and mid RCA on 07/11/2016.  Past medical history significant for hyperlipidemia.  This is annual visit, he lives in [place] during winter and travels back to [place] during the summer, however they have decided to move permanently back to [place].  He underwent seed implantation for prostate cancer.  He has noticed gradually worsening dyspnea.  He has not had any exertional chest pain.  Tolerating all his medications well.  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   Varicose vein of leg    bilateral   Past Surgical History:  Procedure Laterality Date   CARDIAC CATHETERIZATION N/A 07/11/2016   Procedure: Left Heart Cath and Coronary Angiography;  Surgeon: Yates Decamp, MD;  Location: [facility] INVASIVE CV LAB;  Service: Cardiovascular;  Laterality: N/A;   CORONARY ANGIOPLASTY WITH STENT PLACEMENT  07/11/2016   "3 stents"   LAPAROSCOPIC CHOLECYSTECTOMY  2014   RADIOACTIVE SEED IMPLANT N/A 07/25/2022   Procedure: RADIOACTIVE SEED IMPLANT/BRACHYTHERAPY IMPLANT;  Surgeon: Jerilee Field, MD;  Location: [facility] ORS;  Service: Urology;  Laterality: N/A;   SPACE OAR INSTILLATION N/A 07/25/2022   Procedure: SPACE OAR INSTILLATION;  Surgeon: Jerilee Field, MD;  Location: [facility] ORS;  Service: Urology;  Laterality: N/A;   Social History   Tobacco Use   Smoking status:  Former    Packs/day: 1.00    Years: 17.00    Additional pack years: 0.00    Total pack years: 17.00    Types: Cigarettes    Quit date: 11/14/1971    Years since quitting: 51.4   Smokeless tobacco: Never  Substance Use Topics   Alcohol use: No   Marital Status: Married  ROS  Review of Systems  Cardiovascular:  Positive for dyspnea on exertion. Negative for chest pain and leg swelling.   Objective  Blood pressure 110/64, pulse (!) 55, resp. rate 16, height 5\' 11"  (1.803 m), weight 190 lb (86.2 kg), SpO2 96 %. Body mass index is 26.5 kg/m.     03/28/2023   10:15 AM 08/15/2022   10:10 AM 07/25/2022   10:05 AM  Vitals with BMI  Height 5\' 11"  5\' 11"    Weight 190 lbs 218 lbs   BMI 26.51 30.42   Systolic 110 101 846  Diastolic 64 58 71  Pulse 55 56 52     Physical Exam Neck:     Vascular: No carotid bruit or JVD.  Cardiovascular:     Rate and Rhythm: Normal rate and regular rhythm.     Pulses: Intact distal pulses.     Heart sounds: Normal heart sounds. No murmur heard.    No gallop.  Pulmonary:     Effort: Pulmonary effort is normal.     Breath sounds: Normal breath sounds.  Abdominal:     General:  Bowel sounds are normal.     Palpations: Abdomen is soft.     Hernia: A hernia is present. Hernia is present in the ventral area.  Musculoskeletal:     Right lower leg: No edema.     Left lower leg: No edema.     Laboratory examination:   Recent Labs    07/14/22 1041  NA 141  K 4.4  CL 113*  CO2 23  GLUCOSE 103*  BUN 19  CREATININE 1.21  CALCIUM 8.9  GFRNONAA 59*   CrCl cannot be calculated (Patient's most recent lab result is older than the maximum 21 days allowed.).     Latest Ref Rng & Units 07/14/2022   10:41 AM 07/18/2021   12:37 PM 07/12/2016    5:23 AM  CMP  Glucose 70 - 99 mg/dL 811  914  98   BUN 8 - 23 mg/dL 19  25  13    Creatinine 0.61 - 1.24 mg/dL 7.82  9.56  2.13   Sodium 135 - 145 mmol/L 141  137  137   Potassium 3.5 - 5.1 mmol/L 4.4  3.7  4.0    Chloride 98 - 111 mmol/L 113  105  108   CO2 22 - 32 mmol/L 23  23  23    Calcium 8.9 - 10.3 mg/dL 8.9  8.9  8.7   Total Protein 6.5 - 8.1 g/dL  7.1    Total Bilirubin 0.3 - 1.2 mg/dL  0.7    Alkaline Phos 38 - 126 U/L  84    AST 15 - 41 U/L  17    ALT 0 - 44 U/L  23        Latest Ref Rng & Units 07/14/2022   10:41 AM 07/18/2021   12:37 PM 07/12/2016    5:23 AM  CBC  WBC 4.0 - 10.5 K/uL 4.8  14.3  8.8   Hemoglobin 13.0 - 17.0 g/dL 08.6  57.8  46.9   Hematocrit 39.0 - 52.0 % 38.9  39.3  40.3   Platelets 150 - 400 K/uL 161  260  138    External labs:   Labs 12/05/2021:  A1c 5.3%, TSH 2.640, normal.  Vitamin D 44.4.  Total cholesterol 138, triglycerides 103, HDL 38, LDL 81.  BUN 13, creatinine 1.10, EGFR 67 mL, potassium 4.4.  Hb 14.5/HCT 41.5, platelets 141.  Normal indicis. Radiology:   No results found.  Cardiac Studies:   Coronary angiogram 07/11/2016:  Normal LVEF. Prox LAD 95% S/P 3.5x24 Synergy DES. Distal LAD 95% S/P 2.5x20 Syncergy DES. Prox RCA 95% S/P 4.0x38 mm Promus DES. Prox Cx 60%.  PCV ECHOCARDIOGRAM COMPLETE 09/15/2021  Normal LV systolic function with visual EF 55-60%. Left ventricle cavity is normal in size. Normal left ventricular wall thickness. Normal global wall motion. Normal diastolic filling pattern, normal LAP. Mild (Grade I) mitral regurgitation. Mild tricuspid regurgitation. No evidence of pulmonary hypertension. Mild pulmonic regurgitation. Compared to study 12/27/2016: Mild MR and PR are new otherwise no significant change.    PCV MYOCARDIAL PERFUSION WITH LEXISCAN 08/31/2021  Lexiscan/modified Bruce Tetrofosmin stress test 08/31/2021: Lexiscan/modified Bruce nuclear stress test performed using 1-day protocol. Patient walked for 2.2 METS and reached 80% MPHR. Normal myocardial perfusion. Stress LVEF is mildly dysfunctional 42%. While there is no definite ischemia, recommend clinical correlation given low stress LVEF and frequent  PVC's. Compared to exercise nuclear stress test 81 2017, large inferior ischemic change no longer present.  Previously treadmill exercise stress test.  EKG:   EKG 03/28/2023: Normal sinus rhythm with rate 55 bpm, leftward enlargement, left axis deviation, left anterior fascicular block.  Incomplete right bundle branch block.  Low-voltage complexes.  Compared to 03/21/2022, no significant change.    Medications and allergies   Allergies  Allergen Reactions   Tylenol [Acetaminophen] Other (See Comments)    dizziness   Aleve [Naproxen Sodium] Other (See Comments)    Dizziness     Current Outpatient Medications:    aspirin 81 MG tablet, Take 81 mg by mouth daily., Disp: , Rfl:    atorvastatin (LIPITOR) 40 MG tablet, TAKE 1 TABLET BY MOUTH  DAILY (Patient taking differently: Take 40 mg by mouth daily.), Disp: 90 tablet, Rfl: 0   Boswellia-Glucosamine-Vit D (GLUCOSAMINE COMPLEX PO), Take 2 capsules by mouth daily. , Disp: , Rfl:    Cholecalciferol (VITAMIN D3) 50 MCG (2000 UT) TABS, Take 2,000 Units by mouth daily., Disp: , Rfl:    losartan (COZAAR) 25 MG tablet, Take 1 tablet (25 mg total) by mouth every evening., Disp: 30 tablet, Rfl: 2   metoprolol succinate (TOPROL-XL) 50 MG 24 hr tablet, Take 0.5 tablets (25 mg total) by mouth daily. Take with or immediately following a meal., Disp: 30 tablet, Rfl: 2   omeprazole (PRILOSEC) 20 MG capsule, Take 20 mg by mouth daily., Disp: , Rfl:    tamsulosin (FLOMAX) 0.4 MG CAPS capsule, Take 0.4 mg by mouth 2 (two) times daily., Disp: , Rfl:    vitamin B-12 (CYANOCOBALAMIN) 1000 MCG tablet, Take 1,000 mcg by mouth daily., Disp: , Rfl:    Assessment     ICD-10-CM   1. Coronary artery disease involving native coronary artery of native heart with other form of angina pectoris (HCC)  I25.118 EKG 12-Lead    metoprolol succinate (TOPROL-XL) 50 MG 24 hr tablet    losartan (COZAAR) 25 MG tablet    Basic metabolic panel    2. Dyspnea on exertion  R06.09  Pro b natriuretic peptide (BNP)    3. Pure hypercholesterolemia  E78.00        Medications Discontinued During This Encounter  Medication Reason   metoprolol succinate (TOPROL-XL) 50 MG 24 hr tablet Dose change     Meds ordered this encounter  Medications   metoprolol succinate (TOPROL-XL) 50 MG 24 hr tablet    Sig: Take 0.5 tablets (25 mg total) by mouth daily. Take with or immediately following a meal.    Dispense:  30 tablet    Refill:  2   losartan (COZAAR) 25 MG tablet    Sig: Take 1 tablet (25 mg total) by mouth every evening.    Dispense:  30 tablet    Refill:  2   Orders Placed This Encounter  Procedures   Basic metabolic panel   Pro b natriuretic peptide (BNP)   EKG 12-Lead   Recommendations:   Noah Campbell is a 82 y.o. Caucasian male patient with coronary artery disease with stable angina pectoris, history of coronary angioplasty proximal LAD and distal LAD and mid RCA on 07/11/2016.  Past medical history significant for hyperlipidemia.  1. Coronary artery disease involving native coronary artery of native heart with other form of angina pectoris [facility]) Patient remains asymptomatic except for chronic dyspnea.  Gradually worsening dyspnea could be related to diastolic dysfunction and also bradycardia related to beta-blocker use.  In the absence of angina pectoris, with no chest pain, although dyspnea could be anginal equivalent, I doubt that.  Will try to  wean him off of the beta-blocker and I will start him on losartan 25 mg in the evening from CAD standpoint.  There is no history of hypertension.  - EKG 12-Lead - metoprolol succinate (TOPROL-XL) 50 MG 24 hr tablet; Take 0.5 tablets (25 mg total) by mouth daily. Take with or immediately following a meal.  Dispense: 30 tablet; Refill: 2 - losartan (COZAAR) 25 MG tablet; Take 1 tablet (25 mg total) by mouth every evening.  Dispense: 30 tablet; Refill: 2 - Basic metabolic panel  2. Dyspnea on exertion Patient has had  an echocardiogram just a year ago, normal LVEF.  Will obtain a pro NT BNP along with BMP in 2 to 3 weeks. - Pro b natriuretic peptide (BNP)  3. Pure hypercholesterolemia His lipids were managed by PCP in [place], I will contact his PCP in [place] and get recent labs, it was done just a month ago.  He is now permanently relocating back to [place].  Like to see him back in 2 months for follow-up.   Yates Decamp, MD, [facility] 03/28/2023, 5:45 PM Office: [phone]

## 2023-05-09 ENCOUNTER — Encounter: Payer: Self-pay | Admitting: Cardiology

## 2023-05-09 ENCOUNTER — Ambulatory Visit: Payer: Medicare Other | Admitting: Cardiology

## 2023-05-09 VITALS — BP 85/53 | HR 54 | Resp 16 | Ht 71.0 in | Wt 184.8 lb

## 2023-05-09 DIAGNOSIS — R0609 Other forms of dyspnea: Secondary | ICD-10-CM

## 2023-05-09 DIAGNOSIS — R9431 Abnormal electrocardiogram [ECG] [EKG]: Secondary | ICD-10-CM

## 2023-05-09 DIAGNOSIS — I25118 Atherosclerotic heart disease of native coronary artery with other forms of angina pectoris: Secondary | ICD-10-CM

## 2023-05-09 DIAGNOSIS — I950 Idiopathic hypotension: Secondary | ICD-10-CM

## 2023-05-09 MED ORDER — TAMSULOSIN HCL 0.4 MG PO CAPS: 0.4000 mg | ORAL_CAPSULE | Freq: Every day | ORAL | Status: AC

## 2023-05-09 NOTE — Progress Notes (Signed)
Primary Physician/Referring:  Mattie Marlin, DO  Patient ID: Noah Campbell, male    DOB: 02/24/1941, 82 y.o.   MRN: [id number]  Chief Complaint  Patient presents with   Coronary Artery Disease   Shortness of Breath   HPI:    Noah Campbell  is a 82 y.o. Caucasian male patient with coronary artery disease with stable angina pectoris, history of coronary angioplasty proximal LAD and distal LAD and mid RCA on 07/11/2016.  Past medical history significant for hyperlipidemia.  Presents here for follow-up of low blood pressure and dyspnea on exertion.  He underwent seed implantation for prostate cancer during summer 2023.  He has noticed gradually worsening dyspnea.  He has not had any exertional chest pain.  Also continues to notice dizziness and low blood pressure.  On his last office visit I discontinued metoprolol and switch him to low-dose of losartan, patient still continues to have some dizziness but states that he is marginally better.  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   Varicose vein of leg    bilateral   Past Surgical History:  Procedure Laterality Date   CARDIAC CATHETERIZATION N/A 07/11/2016   Procedure: Left Heart Cath and Coronary Angiography;  Surgeon: Yates Decamp, MD;  Location: [facility] INVASIVE CV LAB;  Service: Cardiovascular;  Laterality: N/A;   CORONARY ANGIOPLASTY WITH STENT PLACEMENT  07/11/2016   "3 stents"   LAPAROSCOPIC CHOLECYSTECTOMY  2014   RADIOACTIVE SEED IMPLANT N/A 07/25/2022   Procedure: RADIOACTIVE SEED IMPLANT/BRACHYTHERAPY IMPLANT;  Surgeon: Jerilee Field, MD;  Location: [facility];  Service: Urology;  Laterality: N/A;   SPACE OAR INSTILLATION N/A 07/25/2022   Procedure: SPACE OAR INSTILLATION;  Surgeon: Jerilee Field, MD;  Location: [facility];   Service: Urology;  Laterality: N/A;   Social History   Tobacco Use   Smoking status: Former    Packs/day: 1.00    Years: 17.00    Additional pack years: 0.00    Total pack years: 17.00    Types: Cigarettes    Quit date: 11/14/1971    Years since quitting: 51.5   Smokeless tobacco: Never  Substance Use Topics   Alcohol use: No   Marital Status: Married  ROS  Review of Systems  Cardiovascular:  Positive for dyspnea on exertion. Negative for chest pain and leg swelling.   Objective  Blood pressure (!) 85/53, pulse (!) 54, resp. rate 16, height 5\' 11"  (1.803 m), weight 184 lb 12.8 oz (83.8 kg), SpO2 98 %. Body mass index is 25.77 kg/m.     05/09/2023   11:52 AM 03/28/2023   10:15 AM 08/15/2022   10:10 AM  Vitals with BMI  Height 5\' 11"  5\' 11"  5\' 11"   Weight 184 lbs 13 oz 190 lbs 218 lbs  BMI 25.79 26.51 30.42  Systolic 85 110 101  Diastolic 53 64 58  Pulse 54 55 56     Physical Exam Neck:     Vascular: No carotid bruit or JVD.  Cardiovascular:     Rate and Rhythm: Normal rate and regular rhythm.     Pulses: Intact distal pulses.     Heart sounds: Normal heart sounds. No murmur heard.    No gallop.  Pulmonary:     Effort: Pulmonary effort  is normal.     Breath sounds: Normal breath sounds.  Abdominal:     General: Bowel sounds are normal.     Palpations: Abdomen is soft.     Hernia: A hernia is present. Hernia is present in the ventral area.  Musculoskeletal:     Right lower leg: No edema.     Left lower leg: No edema.     Laboratory examination:   Recent Labs    07/14/22 1041  NA 141  K 4.4  CL 113*  CO2 23  GLUCOSE 103*  BUN 19  CREATININE 1.21  CALCIUM 8.9  GFRNONAA 59*      Latest Ref Rng & Units 07/14/2022   10:41 AM 07/18/2021   12:37 PM 07/12/2016    5:23 AM  CMP  Glucose 70 - 99 mg/dL 782  956  98   BUN 8 - 23 mg/dL 19  25  13    Creatinine 0.61 - 1.24 mg/dL 2.13  0.86  5.78   Sodium 135 - 145 mmol/L 141  137  137   Potassium 3.5 - 5.1 mmol/L  4.4  3.7  4.0   Chloride 98 - 111 mmol/L 113  105  108   CO2 22 - 32 mmol/L 23  23  23    Calcium 8.9 - 10.3 mg/dL 8.9  8.9  8.7   Total Protein 6.5 - 8.1 g/dL  7.1    Total Bilirubin 0.3 - 1.2 mg/dL  0.7    Alkaline Phos 38 - 126 U/L  84    AST 15 - 41 U/L  17    ALT 0 - 44 U/L  23        Latest Ref Rng & Units 07/14/2022   10:41 AM 07/18/2021   12:37 PM 07/12/2016    5:23 AM  CBC  WBC 4.0 - 10.5 K/uL 4.8  14.3  8.8   Hemoglobin 13.0 - 17.0 g/dL 46.9  62.9  52.8   Hematocrit 39.0 - 52.0 % 38.9  39.3  40.3   Platelets 150 - 400 K/uL 161  260  138    External labs:   Labs 12/05/2021:  A1c 5.3%, TSH 2.640, normal.  Vitamin D 44.4.  Total cholesterol 138, triglycerides 103, HDL 38, LDL 81.  BUN 13, creatinine 1.10, EGFR 67 mL, potassium 4.4.  Hb 14.5/HCT 41.5, platelets 141.  Normal indicis. Radiology:   No results found.  Cardiac Studies:   Coronary angiogram 07/11/2016:  Normal LVEF. Prox LAD 95% S/P 3.5x24 Synergy DES. Distal LAD 95% S/P 2.5x20 Syncergy DES. Prox RCA 95% S/P 4.0x38 mm Promus DES. Prox Cx 60%.  PCV ECHOCARDIOGRAM COMPLETE 09/15/2021  Normal LV systolic function with visual EF 55-60%. Left ventricle cavity is normal in size. Normal left ventricular wall thickness. Normal global wall motion. Normal diastolic filling pattern, normal LAP. Mild (Grade I) mitral regurgitation. Mild tricuspid regurgitation. No evidence of pulmonary hypertension. Mild pulmonic regurgitation. Compared to study 12/27/2016: Mild MR and PR are new otherwise no significant change.    PCV MYOCARDIAL PERFUSION WITH LEXISCAN 08/31/2021  Lexiscan/modified Bruce Tetrofosmin stress test 08/31/2021: Lexiscan/modified Bruce nuclear stress test performed using 1-day protocol. Patient walked for 2.2 METS and reached 80% MPHR. Normal myocardial perfusion. Stress LVEF is mildly dysfunctional 42%. While there is no definite ischemia, recommend clinical correlation given low stress LVEF and frequent  PVC's. Compared to exercise nuclear stress test 81 2017, large inferior ischemic change no longer present.  Previously treadmill exercise stress test.  EKG:   EKG 03/28/2023: Normal sinus rhythm with rate 55 bpm, leftward enlargement, left axis deviation, left anterior fascicular block.  Incomplete right bundle branch block.  Low-voltage complexes.  Compared to 03/21/2022, no significant change.   Medications and allergies   Allergies  Allergen Reactions   Tylenol [Acetaminophen] Other (See Comments)    dizziness   Aleve [Naproxen Sodium] Other (See Comments)    Dizziness     Current Outpatient Medications:    aspirin 81 MG tablet, Take 81 mg by mouth daily., Disp: , Rfl:    atorvastatin (LIPITOR) 40 MG tablet, TAKE 1 TABLET BY MOUTH  DAILY (Patient taking differently: Take 40 mg by mouth daily.), Disp: 90 tablet, Rfl: 0   Boswellia-Glucosamine-Vit D (GLUCOSAMINE COMPLEX PO), Take 2 capsules by mouth daily. , Disp: , Rfl:    Cholecalciferol (VITAMIN D3) 50 MCG (2000 UT) TABS, Take 2,000 Units by mouth daily., Disp: , Rfl:    NON FORMULARY, Take 50 mg by mouth daily. Ingenus, Disp: , Rfl:    omeprazole (PRILOSEC) 20 MG capsule, Take 20 mg by mouth daily., Disp: , Rfl:    vitamin B-12 (CYANOCOBALAMIN) 1000 MCG tablet, Take 1,000 mcg by mouth daily., Disp: , Rfl:    tamsulosin (FLOMAX) 0.4 MG CAPS capsule, Take 1 capsule (0.4 mg total) by mouth daily after supper., Disp: 30 capsule, Rfl:    Assessment     ICD-10-CM   1. Coronary artery disease involving native coronary artery of native heart with other form of angina pectoris (HCC)  I25.118 PCV ECHOCARDIOGRAM COMPLETE    PCV MYOCARDIAL PERFUSION WO LEXISCAN    CANCELED: EKG 12-Lead    2. Dyspnea on exertion  R06.09 PCV MYOCARDIAL PERFUSION WO LEXISCAN    Kappa/Lambda Light Chains, Free, With Ratio, 24Hr. Urine    3. Idiopathic hypotension  I95.0 PCV MYOCARDIAL PERFUSION WO LEXISCAN    Kappa/Lambda Light Chains, Free, With Ratio,  24Hr. Urine    4. Abnormal EKG  R94.31        Medications Discontinued During This Encounter  Medication Reason   metoprolol succinate (TOPROL-XL) 50 MG 24 hr tablet Discontinued by provider   losartan (COZAAR) 25 MG tablet Discontinued by provider   tamsulosin (FLOMAX) 0.4 MG CAPS capsule Reorder     Meds ordered this encounter  Medications   tamsulosin (FLOMAX) 0.4 MG CAPS capsule    Sig: Take 1 capsule (0.4 mg total) by mouth daily after supper.    Dispense:  30 capsule   Orders Placed This Encounter  Procedures   Kappa/Lambda Light Chains, Free, With Ratio, 24Hr. Urine   PCV MYOCARDIAL PERFUSION WO LEXISCAN    Standing Status:   Future    Standing Expiration Date:   07/09/2023   PCV ECHOCARDIOGRAM COMPLETE    Standing Status:   Future    Standing Expiration Date:   05/08/2024   Recommendations:   Noah Campbell is a 82 y.o. Caucasian male patient with coronary artery disease with stable angina pectoris, history of coronary angioplasty proximal LAD and distal LAD and mid RCA on 07/11/2016.  Past medical history significant for hyperlipidemia.  Presents here for follow-up of low blood pressure and dyspnea on exertion.  1. Coronary artery disease involving native coronary artery of native heart with other form of angina pectoris [facility]) Patient with coronary disease, continues to have dyspnea on exertion, he is no developed low blood pressure, also has tingling and numbness in his hands, low-voltage complexes noted on the EKG, I would  like to exclude cardiac amyloidosis.  I will repeat echocardiogram.  I will also exclude progression of coronary disease, will perform exercise nuclear stress test.  His metoprolol was weaned off on his last office visit due to low blood pressure, I started him on losartan 25 mg daily which I will discontinue for now as his blood pressure is low and soft.  Patient is also on tamsulosin 0.4 mg twice daily, advised him to reduce it to once a day in the  evening in view of low blood pressure.  Keep himself well-hydrated.  - PCV ECHOCARDIOGRAM COMPLETE; Future - PCV MYOCARDIAL PERFUSION WO LEXISCAN; Future  2. Dyspnea on exertion Would like to exclude progression of coronary disease and also to exclude amyloid heart disease.  Will perform a urine analysis - PCV MYOCARDIAL PERFUSION WO LEXISCAN; Future - Kappa/Lambda Light Chains, Free, With Ratio, 24Hr. Urine  3. Idiopathic hypotension  - PCV MYOCARDIAL PERFUSION WO LEXISCAN; Future - Kappa/Lambda Light Chains, Free, With Ratio, 24Hr. Urine  4. Abnormal EKG He has low voltage complexes.  He may return to the category of amyloid heart disease with worsening dizziness, low blood pressure, tingling and numbness in his hands.  Will make further recommendations after I see him back in about 4 to 6 weeks.  Other orders - NON FORMULARY; Take 50 mg by mouth daily. Ingenus - tamsulosin (FLOMAX) 0.4 MG CAPS capsule; Take 1 capsule (0.4 mg total) by mouth daily after supper.  Dispense: 30 capsule   1. Coronary artery disease involving native coronary artery of native heart with other form of angina pectoris [facility]) Patient remains asymptomatic except for chronic dyspnea.  Gradually worsening dyspnea could be related to diastolic dysfunction and also bradycardia related to beta-blocker use.  In the absence of angina pectoris, with no chest pain, although dyspnea could be anginal equivalent, I doubt that.  Will try to wean him off of the beta-blocker and I will start him on losartan 25 mg in the evening from CAD standpoint.  There is no history of hypertension.  - EKG 12-Lead - metoprolol succinate (TOPROL-XL) 50 MG 24 hr tablet; Take 0.5 tablets (25 mg total) by mouth daily. Take with or immediately following a meal.  Dispense: 30 tablet; Refill: 2 - losartan (COZAAR) 25 MG tablet; Take 1 tablet (25 mg total) by mouth every evening.  Dispense: 30 tablet; Refill: 2 - Basic metabolic panel  2. Dyspnea  on exertion Patient has had an echocardiogram just a year ago, normal LVEF.  Will obtain a pro NT BNP along with BMP in 2 to 3 weeks. - Pro b natriuretic peptide (BNP)  3. Pure hypercholesterolemia His lipids were managed by PCP in [place], I will contact his PCP in [place] and get recent labs, it was done just a month ago.  He is now permanently relocating back to [place].  Like to see him back in 2 months for follow-up.   Yates Decamp, MD, [facility] 05/09/2023, 12:37 PM Office: [phone]

## 2023-05-10 ENCOUNTER — Telehealth: Payer: Self-pay

## 2023-05-10 NOTE — Telephone Encounter (Signed)
Will change to Noah Campbell

## 2023-05-10 NOTE — Addendum Note (Signed)
Addended by: Delrae Rend on: 05/10/2023 11:34 PM   Modules accepted: Orders

## 2023-05-10 NOTE — Telephone Encounter (Signed)
Will change to lexi nuclear stress

## 2023-05-10 NOTE — Telephone Encounter (Signed)
Pt does not think he can do the treadmill part of stress test

## 2023-05-15 LAB — KAPPA/LAMBDA LIGHT CHAINS, FREE, WITH RATIO, 24HR. URINE
FR KAPPA LT CH,24HR: 32.62 mg/24 hr
FR LAMBDA LT CH,24HR: 4.81 mg/24 hr
Free Kappa Lt Chains,Ur: 17.17 mg/L (ref 1.17–86.46)
Free Lambda Lt Chains,Ur: 2.53 mg/L (ref 0.27–15.21)
Kappa/Lambda Ratio,U: 6.79 (ref 1.83–14.26)

## 2023-05-15 NOTE — Progress Notes (Signed)
Labs do not suggest Amyloid heart disease

## 2023-05-21 ENCOUNTER — Ambulatory Visit: Payer: Medicare Other

## 2023-05-21 DIAGNOSIS — I25118 Atherosclerotic heart disease of native coronary artery with other forms of angina pectoris: Secondary | ICD-10-CM

## 2023-05-21 DIAGNOSIS — R0609 Other forms of dyspnea: Secondary | ICD-10-CM

## 2023-06-05 ENCOUNTER — Ambulatory Visit: Payer: Medicare Other

## 2023-06-05 DIAGNOSIS — I25118 Atherosclerotic heart disease of native coronary artery with other forms of angina pectoris: Secondary | ICD-10-CM

## 2023-06-22 ENCOUNTER — Other Ambulatory Visit: Payer: Self-pay | Admitting: Cardiology

## 2023-06-22 DIAGNOSIS — I25118 Atherosclerotic heart disease of native coronary artery with other forms of angina pectoris: Secondary | ICD-10-CM

## 2023-06-25 ENCOUNTER — Ambulatory Visit: Payer: Medicare Other | Admitting: Cardiology

## 2023-06-25 ENCOUNTER — Encounter: Payer: Self-pay | Admitting: Cardiology

## 2023-06-25 VITALS — BP 93/58 | HR 68 | Resp 16 | Ht 71.0 in | Wt 184.2 lb

## 2023-06-25 DIAGNOSIS — I950 Idiopathic hypotension: Secondary | ICD-10-CM

## 2023-06-25 DIAGNOSIS — I25118 Atherosclerotic heart disease of native coronary artery with other forms of angina pectoris: Secondary | ICD-10-CM

## 2023-06-25 DIAGNOSIS — R0609 Other forms of dyspnea: Secondary | ICD-10-CM

## 2023-06-25 MED ORDER — MIDODRINE HCL 2.5 MG PO TABS: 2.5000 mg | ORAL_TABLET | Freq: Three times a day (TID) | ORAL | 6 refills | Status: DC

## 2023-06-25 NOTE — Progress Notes (Signed)
Primary Physician/Referring:  Mattie Marlin, DO  Patient ID: RIEN PATRICE, male    DOB: April 21, 1941, 82 y.o.   MRN: [id number]  Chief Complaint  Patient presents with   Coronary artery disease involving native coronary artery of   Hypotension   Shortness of Breath   Results    Labs and stress tests   Follow-up    6 weeks   HPI:    Noah Campbell  is a 82 y.o. Caucasian male patient with coronary artery disease with stable angina pectoris, history of coronary angioplasty proximal LAD and distal LAD and mid RCA on 07/11/2016.  Past medical history significant for hyperlipidemia.  Presents here for follow-up of low blood pressure and dyspnea on exertion.  He underwent seed implantation for prostate cancer during summer 2023.  He has noticed gradually worsening dyspnea.  He has not had any exertional chest pain.    On his last office visit about 8 weeks ago, due to low blood pressure I discontinued all his cardiac medications and reduce the dose of Flomax to 0.4 mg in the evening from twice daily dosing.  He underwent stress testing and echocardiogram and presents for follow-up.  Patient has noticed marked improvement in overall wellbeing and has resumed all his activities.  Dyspnea has improved and dizziness has improved as well.  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   Varicose vein of leg    bilateral   Past Surgical History:  Procedure Laterality Date   CARDIAC CATHETERIZATION N/A 07/11/2016   Procedure: Left Heart Cath and Coronary Angiography;  Surgeon: Yates Decamp, MD;  Location: [facility] INVASIVE CV LAB;  Service: Cardiovascular;  Laterality: N/A;   CORONARY ANGIOPLASTY WITH STENT PLACEMENT  07/11/2016   "3 stents"   LAPAROSCOPIC CHOLECYSTECTOMY  2014   RADIOACTIVE SEED IMPLANT  N/A 07/25/2022   Procedure: RADIOACTIVE SEED IMPLANT/BRACHYTHERAPY IMPLANT;  Surgeon: Jerilee Field, MD;  Location: [facility];  Service: Urology;  Laterality: N/A;   SPACE OAR INSTILLATION N/A 07/25/2022   Procedure: SPACE OAR INSTILLATION;  Surgeon: Jerilee Field, MD;  Location: [facility];  Service: Urology;  Laterality: N/A;   Social History   Tobacco Use   Smoking status: Former    Current packs/day: 0.00    Average packs/day: 1 pack/day for 17.0 years (17.0 ttl pk-yrs)    Types: Cigarettes    Start date: 11/13/1954    Quit date: 11/14/1971    Years since quitting: 51.6   Smokeless tobacco: Never  Substance Use Topics   Alcohol use: No   Marital Status: Married  ROS  Review of Systems  Cardiovascular:  Positive for dyspnea on exertion. Negative for chest pain and leg swelling.   Objective  Blood pressure (!) 93/58, pulse 68, resp. rate 16, height 5\' 11"  (1.803 m), weight 184 lb 3.2 oz (83.6 kg), SpO2 97%. Body mass index is 25.69 kg/m.     06/25/2023   11:36 AM 05/09/2023   11:52 AM 03/28/2023   10:15 AM  Vitals with BMI  Height 5\' 11"  5\' 11"  5\' 11"   Weight 184 lbs 3 oz 184 lbs 13 oz 190 lbs  BMI 25.7 25.79 26.51  Systolic 93 85 110  Diastolic 58 53 64  Pulse 68 54 55  Physical Exam Neck:     Vascular: No carotid bruit or JVD.  Cardiovascular:     Rate and Rhythm: Normal rate and regular rhythm.     Pulses: Intact distal pulses.     Heart sounds: Normal heart sounds. No murmur heard.    No gallop.  Pulmonary:     Effort: Pulmonary effort is normal.     Breath sounds: Normal breath sounds.  Abdominal:     General: Bowel sounds are normal.     Palpations: Abdomen is soft.     Hernia: A hernia is present. Hernia is present in the ventral area.  Musculoskeletal:     Right lower leg: No edema.     Left lower leg: No edema.     Laboratory examination:   Recent Labs    07/14/22 1041  NA 141  K 4.4  CL 113*  CO2 23  GLUCOSE 103*  BUN 19  CREATININE 1.21   CALCIUM 8.9  GFRNONAA 59*      Latest Ref Rng & Units 07/14/2022   10:41 AM 07/18/2021   12:37 PM 07/12/2016    5:23 AM  CMP  Glucose 70 - 99 mg/dL 063  016  98   BUN 8 - 23 mg/dL 19  25  13    Creatinine 0.61 - 1.24 mg/dL 0.10  9.32  3.55   Sodium 135 - 145 mmol/L 141  137  137   Potassium 3.5 - 5.1 mmol/L 4.4  3.7  4.0   Chloride 98 - 111 mmol/L 113  105  108   CO2 22 - 32 mmol/L 23  23  23    Calcium 8.9 - 10.3 mg/dL 8.9  8.9  8.7   Total Protein 6.5 - 8.1 g/dL  7.1    Total Bilirubin 0.3 - 1.2 mg/dL  0.7    Alkaline Phos 38 - 126 U/L  84    AST 15 - 41 U/L  17    ALT 0 - 44 U/L  23        Latest Ref Rng & Units 07/14/2022   10:41 AM 07/18/2021   12:37 PM 07/12/2016    5:23 AM  CBC  WBC 4.0 - 10.5 K/uL 4.8  14.3  8.8   Hemoglobin 13.0 - 17.0 g/dL 73.2  20.2  54.2   Hematocrit 39.0 - 52.0 % 38.9  39.3  40.3   Platelets 150 - 400 K/uL 161  260  138    Kappa/Lambda Light Chains, Free, With Ratio, 24Hr. 05/11/2023:  Negative for Amyloid heart disease   External labs:   Labs 04/12/2023:  LFTs normal.  Iron studies reveal normal iron level, TIBC mildly reduced at 220 and transferrin saturation normal at 45.  Labs 12/05/2021:  A1c 5.3%, TSH 2.640, normal.  Vitamin D 44.4.  Total cholesterol 138, triglycerides 103, HDL 38, LDL 81.  BUN 13, creatinine 1.10, EGFR 67 mL, potassium 4.4.  Hb 14.5/HCT 41.5, platelets 141.  Normal indicis. Radiology:   No results found.  Cardiac Studies:   Coronary angiogram 07/11/2016:  Normal LVEF. Prox LAD 95% S/P 3.5x24 Synergy DES. Distal LAD 95% S/P 2.5x20 Syncergy DES. Prox RCA 95% S/P 4.0x38 mm Promus DES. Prox Cx 60%.   PCV ECHOCARDIOGRAM COMPLETE 06/05/2023  Narrative Echocardiogram 06/05/2023: Study Quality: Technically difficult study. Normal LV systolic function with visual EF 55-60%. Left ventricle cavity is normal in size. Normal left ventricular wall thickness. Normal global wall motion. Unable to evaluate diastolic  function due to suboptimal  image acquisition. Mild (Grade I) mitral regurgitation. Mild tricuspid regurgitation. No evidence of pulmonary hypertension. Mild pulmonic regurgitation. Compared to 09/15/2021 no significant change.  PCV MYOCARDIAL PERFUSION WITH LEXISCAN 05/21/2023  Narrative Regadenoson (with Mod Bruce protocol) Nuclear stress test 05/21/2023: There is prominent diaphragmatic attenuation artifact noted in the in the inferior wall especially in stress images. Ischemia cannot be completely excluded but appears less likely in view of normal wall motion. Overall LV systolic function is normal without regional wall motion abnormalities. Stress LV EF: 55%. Nondiagnostic ECG stress. The heart rate response was consistent with Regadenoson. Compared to previous study: 08/31/2021, soft tissue attenuation noted in inferior wall and EF was calculated at 42%. Low risk.  EKG:   EKG 03/28/2023: Normal sinus rhythm with rate 55 bpm, leftward enlargement, left axis deviation, left anterior fascicular block.  Incomplete right bundle branch block.  Low-voltage complexes.  Compared to 03/21/2022, no significant change.   Medications and allergies   Allergies  Allergen Reactions   Tylenol [Acetaminophen] Other (See Comments)    dizziness   Aleve [Naproxen Sodium] Other (See Comments)    Dizziness     Current Outpatient Medications:    aspirin 81 MG tablet, Take 81 mg by mouth daily., Disp: , Rfl:    atorvastatin (LIPITOR) 40 MG tablet, TAKE 1 TABLET BY MOUTH  DAILY (Patient taking differently: Take 40 mg by mouth daily.), Disp: 90 tablet, Rfl: 0   Boswellia-Glucosamine-Vit D (GLUCOSAMINE COMPLEX PO), Take 2 capsules by mouth daily. , Disp: , Rfl:    Cholecalciferol (VITAMIN D3) 50 MCG (2000 UT) TABS, Take 2,000 Units by mouth daily., Disp: , Rfl:    midodrine (PROAMATINE) 2.5 MG tablet, Take 1 tablet (2.5 mg total) by mouth 3 (three) times daily with meals. While awake, Disp: 90 tablet, Rfl:  6   NON FORMULARY, Take 50 mg by mouth daily. Ingenus, Disp: , Rfl:    omeprazole (PRILOSEC) 20 MG capsule, Take 20 mg by mouth daily., Disp: , Rfl:    tamsulosin (FLOMAX) 0.4 MG CAPS capsule, Take 1 capsule (0.4 mg total) by mouth daily after supper., Disp: 30 capsule, Rfl:    vitamin B-12 (CYANOCOBALAMIN) 1000 MCG tablet, Take 1,000 mcg by mouth daily., Disp: , Rfl:    Assessment     ICD-10-CM   1. Coronary artery disease involving native coronary artery of native heart with other form of angina pectoris (HCC)  I25.118     2. Dyspnea on exertion  R06.09     3. Idiopathic hypotension  I95.0 midodrine (PROAMATINE) 2.5 MG tablet       There are no discontinued medications.    Meds ordered this encounter  Medications   midodrine (PROAMATINE) 2.5 MG tablet    Sig: Take 1 tablet (2.5 mg total) by mouth 3 (three) times daily with meals. While awake    Dispense:  90 tablet    Refill:  6   No orders of the defined types were placed in this encounter.  Recommendations:   Noah Campbell is a 82 y.o. Caucasian male patient with coronary artery disease with stable angina pectoris, history of coronary angioplasty proximal LAD and distal LAD and mid RCA on 07/11/2016.  Past medical history significant for hyperlipidemia.  Presents here for follow-up of low blood pressure and dyspnea on exertion.  1. Coronary artery disease involving native coronary artery of native heart with other form of angina pectoris (HCC) I did with the results of the echocardiogram and also nuclear stress test,  low risk stress test and LVEF fortunately is well-preserved.  2. Dyspnea on exertion Suspect his dyspnea is related to deconditioning, he is now 82 years of age.  Do not suspect amyloid heart disease.  3. Idiopathic hypotension Patient has developed idiopathic hypotension, he is not orthostatic, his lab work reviewed, there is no significant metabolic abnormality.  Weight has remained stable.  As his  symptoms are now improved with discontinuing all his cardiac medications (beta-blockers and ARB) and reducing the dose of the tamsulosin to 1 capsule in the evening, symptoms are improved.  However he still continues to remain fairly active, I will start him on midodrine 2.5 mg p.o. 3 times daily.  Instructions to avoid laying down discussed with the patient to avoid supine hypertension.  Otherwise I will see him back in 6 months for follow-up.  I suspect his hypotension was related to recent prostate cancer therapy and weight loss.   - midodrine (PROAMATINE) 2.5 MG tablet; Take 1 tablet (2.5 mg total) by mouth 3 (three) times daily with meals. While awake  Dispense: 90 tablet; Refill: 6   Yates Decamp, MD, [facility] 06/25/2023, 12:44 PM Office: [phone]

## 2023-07-05 ENCOUNTER — Other Ambulatory Visit: Payer: Self-pay | Admitting: Cardiology

## 2023-07-05 DIAGNOSIS — I25118 Atherosclerotic heart disease of native coronary artery with other forms of angina pectoris: Secondary | ICD-10-CM

## 2023-08-02 ENCOUNTER — Other Ambulatory Visit: Payer: Self-pay | Admitting: Cardiology

## 2023-08-02 DIAGNOSIS — I25118 Atherosclerotic heart disease of native coronary artery with other forms of angina pectoris: Secondary | ICD-10-CM

## 2023-11-20 ENCOUNTER — Telehealth: Payer: Self-pay | Admitting: Cardiology

## 2023-11-20 NOTE — Telephone Encounter (Signed)
 Pt called stating he needs handicap placard signed this week. Please advise.

## 2023-11-20 NOTE — Telephone Encounter (Signed)
Yes I can.

## 2023-11-26 NOTE — Telephone Encounter (Signed)
 I spoke with patient who reports his PCP completed form and he no longer needs it from Dr Jacinto Halim

## 2023-12-26 ENCOUNTER — Ambulatory Visit: Payer: Medicare Other | Attending: Cardiology | Admitting: Cardiology

## 2023-12-26 ENCOUNTER — Encounter: Payer: Self-pay | Admitting: Cardiology

## 2023-12-26 VITALS — BP 112/68 | HR 72 | Resp 16 | Ht 71.0 in | Wt 191.4 lb

## 2023-12-26 DIAGNOSIS — I950 Idiopathic hypotension: Secondary | ICD-10-CM | POA: Diagnosis not present

## 2023-12-26 DIAGNOSIS — R0609 Other forms of dyspnea: Secondary | ICD-10-CM | POA: Diagnosis not present

## 2023-12-26 DIAGNOSIS — E7800 Pure hypercholesterolemia, unspecified: Secondary | ICD-10-CM

## 2023-12-26 DIAGNOSIS — I25118 Atherosclerotic heart disease of native coronary artery with other forms of angina pectoris: Secondary | ICD-10-CM

## 2023-12-26 MED ORDER — MIDODRINE HCL 2.5 MG PO TABS: 2.5000 mg | ORAL_TABLET | Freq: Three times a day (TID) | ORAL | 1 refills | Status: DC

## 2023-12-26 MED ORDER — ATORVASTATIN CALCIUM 40 MG PO TABS: 40.0000 mg | ORAL_TABLET | Freq: Every day | ORAL | 3 refills | Status: AC

## 2023-12-26 NOTE — Progress Notes (Signed)
Cardiology Office Note:  .   Date:  12/26/2023  ID:  Noah Campbell, DOB [birth year], MRN [id number] PCP: Noah Marlin, DO  [facility] Providers Cardiologist:  Noah Decamp, MD   History of Present Illness: Noah Campbell Kitchen   Noah Campbell is a 83 y.o. Caucasian male patient with coronary artery disease with stable angina pectoris, history of coronary angioplasty proximal LAD and distal LAD and mid RCA on 07/11/2016.  Past medical history significant for hyperlipidemia, hypertension.     He underwent seed implantation for prostate cancer during summer 2023, weight loss, his blood pressure has been trending down and in fact his beta-blocker therapy and losartan had to be discontinued and he was also given midodrine due to marked dizziness.  He is also developed marked dyspnea on exertion. He presents here for a 62-month office visit.  He is presently essentially asymptomatic except for very mild dyspnea.  States that he has not had any further dizziness since being on midodrine and request 90-day Rx.  He has not had any chest pain and has not used any sublingual nitroglycerin.  Discussed the use of AI scribe software for clinical note transcription with the patient, who gave verbal consent to proceed.  History of Present Illness   Noah Campbell, a patient with a history of coronary artery disease and prostate cancer, presents for a routine follow-up. He reports that his blood pressure has been stable and he has not experienced any dizziness since starting midodrine. He inquires about increasing the quantity of midodrine prescribed to reduce the frequency of pharmacy visits, which the doctor agrees to.  Regarding his prostate health, Noah Campbell reports that his urinary frequency has improved significantly. He used to wake up every couple of hours at night to urinate, but now he only wakes up once around 4 or 5 in the morning. He attributes this improvement to a reduction in his fluid intake and a change in  his medication regimen. He was previously taking two doses of tamsulosin daily, but this was reduced to one dose due to concerns about his blood pressure. Despite this reduction, his urinary symptoms have not worsened.  Noah Campbell also mentions that he has been experiencing knee pain for the past 30 years. He had considered getting knee replacement surgery, but is now leaning towards not having the procedure due to the lengthy recovery period.      Labs   External Labs:  PCP Labs 04/12/2023:   LFTs normal.   Iron studies reveal normal iron level, TIBC mildly reduced at 220 and transferrin saturation normal at 45.   Labs 12/05/2021:   A1c 5.3%, TSH 2.640, normal.  Vitamin D 44.4.   Total cholesterol 138, triglycerides 103, HDL 38, LDL 81.   BUN 13, creatinine 1.10, EGFR 67 mL, potassium 4.4.   Hb 14.5/HCT 41.5, platelets 141.  Normal indicis.  Review of Systems  Cardiovascular:  Positive for dyspnea on exertion (very mild and improved). Negative for chest pain and leg swelling.   Physical Exam:   VS:  BP 112/68 (BP Location: Left Arm, Patient Position: Sitting, Cuff Size: Normal)   Pulse 72   Resp 16   Ht 5\' 11"  (1.803 m)   Wt 191 lb 6.4 oz (86.8 kg)   SpO2 98%   BMI 26.69 kg/m    Wt Readings from Last 3 Encounters:  12/26/23 191 lb 6.4 oz (86.8 kg)  06/25/23 184 lb 3.2 oz (83.6 kg)  05/09/23 184 lb 12.8 oz (  83.8 kg)    Physical Exam Neck:     Vascular: No carotid bruit or JVD.  Cardiovascular:     Rate and Rhythm: Normal rate and regular rhythm.     Pulses: Intact distal pulses.     Heart sounds: Normal heart sounds. No murmur heard.    No gallop.  Pulmonary:     Effort: Pulmonary effort is normal.     Breath sounds: Normal breath sounds.  Musculoskeletal:     Right lower leg: No edema.     Left lower leg: No edema.    Studies Reviewed: .   ] ECHOCARDIOGRAM COMPLETE 06/05/2023 Quality: Technically difficult study. Normal LV systolic function with visual EF  55-60%. Left ventricle cavity is normal in size. Normal left ventricular wall thickness. Normal global wall motion. Unable to evaluate diastolic function due to suboptimal image acquisition. Mild (Grade I) mitral regurgitation. Mild tricuspid regurgitation. No evidence of pulmonary hypertension. Mild pulmonic regurgitation. Compared to 09/15/2021 no significant change.  EKG:    EKG Interpretation Date/Time:  Wednesday December 26 2023 12:24:51 EST Ventricular Rate:  67 PR Interval:  168 QRS Duration:  98 QT Interval:  398 QTC Calculation: 420 R Axis:   -61  Text Interpretation: EKG 12/26/2023: Normal sinus rhythm at rate of 67 bpm, left anterior fascicular block.  No evidence of ischemia.  Compared to 03/28/2023, no significant change. Confirmed by Delrae Rend [phone]) on 12/26/2023 12:57:33 PM    Medications and allergies    Allergies  Allergen Reactions   Tylenol [Acetaminophen] Other (See Comments)    dizziness   Aleve [Naproxen Sodium] Other (See Comments)    Dizziness      Current Outpatient Medications:    aspirin 81 MG tablet, Take 81 mg by mouth daily., Disp: , Rfl:    Boswellia-Glucosamine-Vit D (GLUCOSAMINE COMPLEX PO), Take 2 capsules by mouth daily. , Disp: , Rfl:    Cholecalciferol (VITAMIN D3) 50 MCG (2000 UT) TABS, Take 2,000 Units by mouth daily., Disp: , Rfl:    omeprazole (PRILOSEC) 20 MG capsule, Take 20 mg by mouth daily., Disp: , Rfl:    tamsulosin (FLOMAX) 0.4 MG CAPS capsule, Take 1 capsule (0.4 mg total) by mouth daily after supper., Disp: 30 capsule, Rfl:    vitamin B-12 (CYANOCOBALAMIN) 1000 MCG tablet, Take 1,000 mcg by mouth daily., Disp: , Rfl:    atorvastatin (LIPITOR) 40 MG tablet, Take 1 tablet (40 mg total) by mouth daily., Disp: 90 tablet, Rfl: 3   midodrine (PROAMATINE) 2.5 MG tablet, Take 1 tablet (2.5 mg total) by mouth 3 (three) times daily with meals. While awake, Disp: 270 tablet, Rfl: 1   ASSESSMENT AND PLAN: .      ICD-10-CM   1.  Coronary artery disease involving native coronary artery of native heart with other form of angina pectoris (HCC)  I25.118 EKG 12-Lead    atorvastatin (LIPITOR) 40 MG tablet    2. Dyspnea on exertion  R06.09     3. Pure hypercholesterolemia  E78.00     4. 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 Current medications include aspirin and atorvastatin. Emphasized the importance of maintaining this regimen to prevent future cardiac events. A cholesterol check is pending coordination with the primary care provider. Continue aspirin and atorvastatin 40 mg daily. Order a cholesterol check with the  PCP and consider adding ezetimibe if cholesterol remains high.  Goal LDL <70.  Patient has an upcoming appointment with his PCP. Dyspnea is remained stable, no clinical evidence of heart failure.  EKG does not reveal any ischemia.  Orthostatic Hypotension This is managed with midodrine, and there have been no recent episodes of dizziness. Advised to adjust the midodrine dosage based on activity level and to take it before activities that may cause orthostasis. Continue midodrine, adjusting dosage as needed up to four times a day, and refill for 90 days. Echocardiogram reviewed again from previous, normal LV systolic function.  Etiology of orthostasis remains unknown.  Could be related to chemotherapy.  Prostate Cancer The condition is well-controlled with reduced nocturia. Doxazosin has been reduced to once daily due to orthostasis, resulting in improved urination and reduced nocturia. Discussed balancing prostate symptom management with avoiding orthostatic hypotension. Continue doxazosin and tamsulosin once daily.  General Health Maintenance Emphasized the importance of regular monitoring of cholesterol and other health parameters. No  recent blood work has been done; will follow up with the PCP for routine checks. Schedule an appointment with the PCP for routine blood work and ensure a cholesterol check.  Follow-up A follow-up with the cardiologist is scheduled in one year.      Signed,  Noah Decamp, MD, [facility] 12/26/2023, 6:37 PM [facility] [address] [place], [place] [address] Phone: [phone]. Fax:  [phone]

## 2023-12-26 NOTE — Patient Instructions (Signed)

## 2024-04-17 ENCOUNTER — Telehealth: Payer: Self-pay | Admitting: Cardiology

## 2024-04-17 DIAGNOSIS — I950 Idiopathic hypotension: Secondary | ICD-10-CM

## 2024-04-17 MED ORDER — MIDODRINE HCL 2.5 MG PO TABS: 2.5000 mg | ORAL_TABLET | Freq: Three times a day (TID) | ORAL | 2 refills | Status: DC

## 2024-04-17 NOTE — Telephone Encounter (Signed)
*  STAT* If patient is at the pharmacy, call can be transferred to refill team.   1. Which medications need to be refilled? (please list name of each medication and dose if known)  midodrine  (PROAMATINE ) 2.5 MG tablet   2. Which pharmacy/location (including street and city if local pharmacy) is medication to be sent to? [facility] [address], [place] - [address]   3. Do they need a 30 day or 90 day supply?   90 day supply

## 2024-04-17 NOTE — Telephone Encounter (Signed)
 Pt's medication was sent to pt's pharmacy as requested. Confirmation received.

## 2024-10-22 ENCOUNTER — Other Ambulatory Visit: Payer: Self-pay | Admitting: Cardiology

## 2024-10-22 DIAGNOSIS — I950 Idiopathic hypotension: Secondary | ICD-10-CM

## 2024-10-23 NOTE — Telephone Encounter (Signed)
 Pt calling to check status of refill. PT is out of medication.

## 2024-12-30 ENCOUNTER — Ambulatory Visit: Admitting: Cardiology
